# Patient Record
Sex: Female | Born: 1955 | Race: White | Hispanic: No | Marital: Married
Health system: Northeastern US, Academic
[De-identification: ages and names within clinical notes are randomized; demographics above are authoritative.]

---

## 2009-11-20 ENCOUNTER — Ambulatory Visit

## 2015-09-21 ENCOUNTER — Ambulatory Visit

## 2015-10-08 ENCOUNTER — Ambulatory Visit

## 2015-10-08 NOTE — Progress Notes (Signed)
Did you self refer to any specialists?  [facility]   Age: 60 Years Old  Last Secure Message:  02/21/2014 - Secure Messaging: Patient Portal Pin Creator  Last PIN change:  02/21/2014         Labs   No eye exam in record! LDL 113 on 08/16/2016HGBA1C =       Most Recent Visits     (Last rx/phone contact: 09/21/2015 )        10/07/2014  Geraldo Pitter MD (.HPI)    11/11/2013  Geraldo Pitter MD (.HPI)                                      Next Appointment(s)  None     Immunizations  No flu vaccine this season     Childhood DTaP  03/26/2010       No Adult Tetanus/Adacel documented !   Last Flu  01/25/2010    Adult PCV-13  10/07/2014      Mammogram: Benign, no evidence of malignancy, f/u in 12 months  Date of Exam: 09/22/2014   PAP: Negative for intraepithelial lesion/malignancyDate of Exam: 2016 . Levi Aland ) pt has appt in august   Bone Density: Complete Date of Exam:  11/11/2014  Colonoscopy: Large rectal polyp Date of Exam:  09/08/2006.      Protocols Due  HEP C AB, PAP SMEAR, TDAP.           Directives   The above represents the pre-visit preparations conducted for this patient.      History of Present Illness:  Ms. 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 osteopenia with a T score of -2 in the spine and normal bone density in the hips in 2016, tubular adenoma removed in 2008 with a repeat normal colonoscopy in 2013, extensive right foot surgery January 2016 with persistent swelling and pain and a steroid injection couple of months ago but continues to have symptoms, left foot hammertoe surgery in 2017, bilateral breast reduction surgery in November 2016 prior history of lumpectomy presents to the office for her annual physical  exam.    she has ongoing issues with right foot and ankle pain as mentioned above.    History of colonic polyps with tubular adenoma    she reports an increase in frequency and severity of migraine headaches.  She has been on Topamax for over 20 years of prophylaxis of the recent increase in dose to 50 mg twice daily. she is followed by neurology          She had been diagnosed with long-standing history of peripheral neuropathy and mild thought to be secondary to radiation treatment for cervical cancer.  She had history of cervical cancer at the age of 6 treated with cone biopsy and a recurrence that needed chemotherapy and radiation. she is followed by Gyn/Onc annually. She is status post chemotherapy, lumpectomy and radiation for right breast carcinoma in 2001. follows at [facility]    She has had long-standing symptoms of tingling and numbness in both feet,  weakness of the left lower extremity, gait imbalance.  Over the past few years she has developed symptoms in the right lower extremity  as well.  She had an MRI of the lumbosacral spine in June 2012, 2 EMGs in 2005 and a repeat in 2009 that showed significant neuropathy so far thought to be secondary to radiation damage.  At one point she was diagnosed with sciatica and had epidural steroid injections with no benefit.    she had extensive neurological and rheumatological workup was otherwise negative results        Review of Systems   General: Denies fever, chills, sweats, anorexia, fatigue, weakness, malaise, weight loss.   Eyes: Denies visual change or blurring, eye pain.   Ears/Nose/Throat: Denies earache, decreased hearing, difficulty swallowing.   Cardiovascular: Denies chest pain or pressure, palpitations, shortness of breath.   Respiratory: Denies dry cough, productive cough, shortness of breath, wheezing.   Gastrointestinal: Denies acid indigestion, nausea, vomiting, diarrhea, abdominal pain, change in bowel habits, constipation, mucous or blood in  stools.   Musculoskeletal: Denies muscle cramps or aches, muscle weakness, morning stiffness, joint pain, joint swelling.   Skin: Denies dry skin, rash, skin ulcers, suspicious lesions, hx of skin cancer.   Psychiatric: Denies anxiety, depression, insomnia.   all others negative  Vital Signs     Weight: 153 lb. Height: 67  in.    BMI: 24.05  BSA: 1.81    Wt chg: 1  Weight: 152 lbs   BMI: 23.89 on 10/07/2014  Pulse rate: 70    Pulse rhythm: regular  Pulse Ox (SpO2): 99  BP: 114/76 - normal cuff, sitting left arm      Patient is experiencing Pain  Location: legs    Intensity: 4    Type: "nerve pain"   Duration: constant    Comments: Annual  Medications and Allergies Reviewed    Signed: Erica A. Molle,[place]....October 08, 2015 8:34 AM  PHQ 2    Over the last 2 weeks, how often have you been bothered by any of the following problems?  1. Little interest or pleasure in doing things:  0   - Not at all  2. Feeling down, depressed, or hopeless:  0   - Not at all        Physical Exam    General:      well developed, well nourished, in no acute distress.    Head:      normocephalic and atraumatic.    Eyes:      PERRL/EOM intact, conjunctiva and sclera clear with out nystagmus.    Ears:      TM's intact and clear with normal canals with grossly normal hearing.    Nose:      no deformity, discharge, inflammation, or lesions.    Mouth:      no deformity or lesions with good dentition.    Neck:      no masses, thyromegaly, or abnormal cervical nodes.    Lungs:      clear bilaterally to auscultation.    Heart:      non-displaced PMI, chest non-tender; regular rate and rhythm, S1, S2 without murmurs, rubs, or gallops  Abdomen:       normal bowel sounds; no hepatosplenomegaly no ventral,umbilical hernias or masses noted.    Msk:      no deformity or scoliosis noted of thoracic or lumbar spine.    Pulses:      pulses normal in all 4 extremities.    Extremities:      no clubbing, cyanosis, edema, or deformity noted with normal full range of  motion of  all joints.    Skin:      intact without lesions or rashes.    Cervical Nodes:      no significant adenopathy.    Axillary Nodes:      no significant adenopathy.    Inguinal Nodes:      no significant adenopathy.    Psych:      alert and cooperative; normal mood and affect; normal attention span and concentration.           Assessment and Plan:     migraines: not doing well on prophylaxis with Topamax,  waiting for biologic meds that are currently under studies    Chronic bilateral lower extremity neuropathy with gait imbalance thought to be a result of remote radiation therapy for cervical cancer.  Underwent extensive foot surgery in January 2016.  See surgical history for details.  Follow-up with surgeon for persistent pain and swelling of the right ankle and foot. status post steroid injection 2 months ago, status post hammertoe surgery on the left earlier this year, was recommended a fusion surgery on the left ankle as well but currently holding off    Remote history of cervical cancer: Follows with GYN    Remote history of breast cancer: regular surveillance by  mammograms. followed by oncology at [facility]    neuropathy:B12/folic acid over-the-counter supplement    Osteopeinia: continue vitamin D supplements, patient exercises regularly    Colonic polyps with history of tubular adenoma, due for repeat colonoscopy    up-to-date with dental and eye exams,up-to-date with vaccinations, bone density test, mammogram, colonoscopy is due    This document was created using voice recognition software and therefore errors may occur.        Problems Reviewed    Medications Removed Today:   RIZATRIPTAN BENZOATE 10 MG TABS (RIZATRIPTAN BENZOATE) Take one tab orally as needed for headache, may repeat once after two hours if needed. max 2tabs/24hrs    New/Revised  Medications Today:   FROVATRIPTAN SUCCINATE 2.5 MG TABS (FROVATRIPTAN SUCCINATE) Take as needed for migraine  FROVATRIPTAN SUCCINATE 2.5 MG TABS (FROVATRIPTAN  SUCCINATE) as needed for migraines        Prescriptions:  FROVATRIPTAN SUCCINATE 2.5 MG TABS (FROVATRIPTAN SUCCINATE) as needed for migraines  #1 x 0   Entered and Authorized by: Geraldo Pitter MD   Signed by: Geraldo Pitter MD on 10/08/2015   Method used: Electronically to      [id number] (retail)     [address] [place], [place]  [address]     Ph: [phone] or [phone]     Fax: [phone]   RxID: [id number]

## 2015-10-09 ENCOUNTER — Ambulatory Visit

## 2015-10-09 NOTE — Progress Notes (Signed)
Orders Added    Orders:  Added new Test order of COL -Colonoscopy (COLONOSCOPY) - Signed  Problems:  Added new problem of SCREENING FOR MALIGNANT NEOPLASM, COLON (ICD-V76.51) (ICD10-Z12.11)

## 2015-10-26 ENCOUNTER — Ambulatory Visit

## 2015-10-26 NOTE — Telephone Encounter (Signed)
Phone Note -       Initial call taken by: Otilio Jefferson,  October 26, 2015 12:13 PM  Initial Details of Call:  patient requested to have a Lymes , having symptoms. An appointment was suggested please call her  Cell [phone]       Follow-up #1  Details: message left to return my call   Action: Left Message for Patient  By: Georga Hacking. Flynn LPN ~ October 26, 2015 1:12 PM    Details: S/W patient she had a tick bite 1 month ago, this was identified as a deer tick, doxy was offered to her by another physician, but she declined.  Recently she has been experiencing a stiff neck at times and this past weekend she had a severe headache accompanied by N/V, she was sick most of the weekend, She has a hx of migraines, but stated this headache was different.  Patient is asking to have a lyme titer drawn, S/W Dr. Domingo Sep, Lyme titer and cbc/diff will be drawn tomorrow, patient aware.   Action: Phone call completed  By: Georga Hacking. [facility] LPN ~ October 26, 2015 2:55 PM        Problems:  Added new problem of BITTEN OR STUNG BY NONVENOMOUS INSECT AND OTHER NONVENOMOUS ARTHROPODS, INITIAL ENCOUNTER (ICD-E906.4) (DGU44-I34.xxxA)  Orders:  Added new Test order of LYME -Lyme Antibody (LYME) - Signed  Added new Test order of CBCD -CBC with Diff** (CBCD) - Signed

## 2015-10-27 ENCOUNTER — Ambulatory Visit

## 2015-10-27 ENCOUNTER — Ambulatory Visit: Admitting: Internal Medicine

## 2015-10-27 LAB — HX CBC W/DIFFX
HX ABSOLUTE BASOPHILS COUNT: 0.01 10*3/uL (ref 0.0–0.33)
HX ABSOLUTE EOSINOPHIL COUNT: 0.11 10*3/uL (ref 0.0–0.88)
HX ABSOLUTE LYMPHS COUNT: 2.74 10*3/uL (ref 0.99–4.84)
HX ABSOLUTE MONOCYTES COUNT: 0.45 10*3/uL (ref 0.18–1.21)
HX ABSOLUTE NEUTROPHIL CNT: 2.63 10*3/uL (ref 1.8–7.7)
HX BASOS: 0.2 %
HX EOS: 1.8 %
HX HEMATOCRIT: 41.9 % (ref 36.0–46.0)
HX HEMOGLOBIN: 14 g/dL (ref 12.0–16.0)
HX IMMATURE GRANULOCYTES: 0.3 % (ref 0.0–1.0)
HX LYMPHS: 46 %
HX MEAN CORP.HEMO.CONC.: 33.4 g/dL (ref 31.0–37.0)
HX MEAN CORPUSCULAR HEMOGLOBIN: 29.7 pg (ref 26.0–34.0)
HX MEAN CORPUSCULAR VOLUME: 88.8 fL (ref 80.0–100.0)
HX MEAN PLATELET VOLUME: 9.2 fL — ABNORMAL LOW (ref 9.4–12.4)
HX MONOS: 7.6 %
HX PLATELET COUNT: 214 10*3/uL (ref 150.0–400.0)
HX POLYS: 44.1
HX RED BLOOD COUNT: 4.7 M/uL (ref 4.0–5.2)
HX RED CELL DISTRIBUTION WIDTH SD: 42.9 fL (ref 35.0–51.0)
HX WHITE BLOOD COUNT: 6 10*3/uL (ref 4.5–11.0)

## 2015-10-27 LAB — HX LYME DISEASE ANTIBODIESX
HX LYME IGG ABX: NEGATIVE
HX LYME IGM ABX: NEGATIVE

## 2015-10-27 NOTE — Progress Notes (Signed)
Clinical Lists Changes    Orders:  Added new Service order of VENIPUNCTURE ([id number]) - Signed

## 2015-10-28 ENCOUNTER — Ambulatory Visit: Admitting: Internal Medicine

## 2015-10-28 ENCOUNTER — Ambulatory Visit

## 2015-11-27 ENCOUNTER — Ambulatory Visit

## 2015-11-27 NOTE — H&P (Signed)
Moderate Sedation History & PhysicalRoxan Hockey MD      Imported By: Brock Bad 12/03/2015 2:36:02 PM    _____________________________________________________________________    External Attachment:      Type: Image      Comment: External Document

## 2015-12-06 ENCOUNTER — Ambulatory Visit

## 2016-04-02 ENCOUNTER — Ambulatory Visit

## 2016-07-26 ENCOUNTER — Ambulatory Visit

## 2016-09-16 ENCOUNTER — Ambulatory Visit

## 2016-09-16 NOTE — Progress Notes (Signed)
[facility]   [address] [place], [place] [address]  Office: [phone] Fax: [phone]     Rachel Hamilton [phone]-Sep-1957)       Printed: September 16, 2016    Active Medication List and Instructions  September 16, 2016     Rachel Hamilton  08/09/1955    1)  TOPAMAX 25 MG ORAL TABLET (TOPIRAMATE) Take one tablet orally in the morning and two tablets at bedtime  2)  VAGIFEM 10 MCG VAGINAL TABLET (ESTRADIOL) Insert twice weekly  3)  * ASO RIGHT ANKLE BRACE   4)  FROVATRIPTAN SUCCINATE 2.5 MG ORAL TABLET (FROVATRIPTAN SUCCINATE) Take as needed for migraine  5)  FROVATRIPTAN SUCCINATE 2.5 MG ORAL TABLET (FROVATRIPTAN SUCCINATE) as needed for migraines    Allergies:  1)  COMPAZINE (PROCHLORPERAZINE MALEATE) (Critical)    Abbreviations:    bid= twice a day   Q= per  D= day    QD= per day  HS= bed time   QID= 4 times a day  OTC= over the counter  QOD= every other day  PO= by mouth   TID= three times a day  PRN= as needed   TID= 3 times a day

## 2016-10-10 ENCOUNTER — Ambulatory Visit

## 2018-10-03 IMAGING — MG MAMMOGRAPHY SCREENING BILATERAL 3D TOMOSYNTHESIS WITH CAD
8 series · 9 of 24 positions shown · non-contrast
Comparison: Comparison was made to prior exams. 
BREAST DENSITY: (Level B) There are scattered areas of fibroglandular density.

MAMMOGRAPHY SCREENING BILATERAL 3D TOMOSYNTHESIS WITH CAD, 10/03/2018 [DATE]: 
CLINICAL INDICATION: Screening exam.
TECHNIQUE: Digital bilateral mammograms and 3-D Tomosynthesis were obtained. 
These were interpreted both primarily and with the aid of computer-aided 
detection system.

[L MLO]
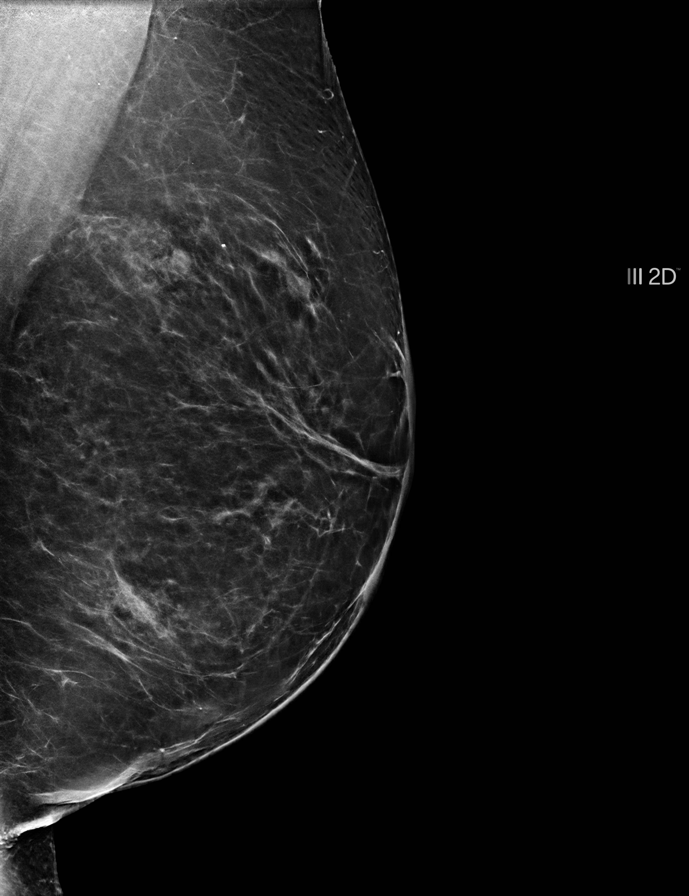

[R CC]
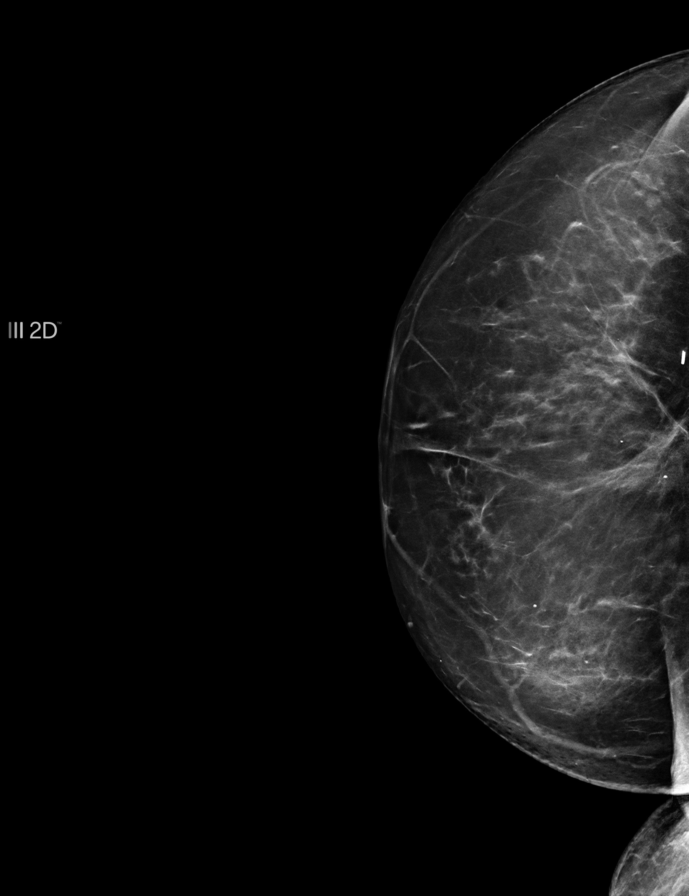

[R MLO]
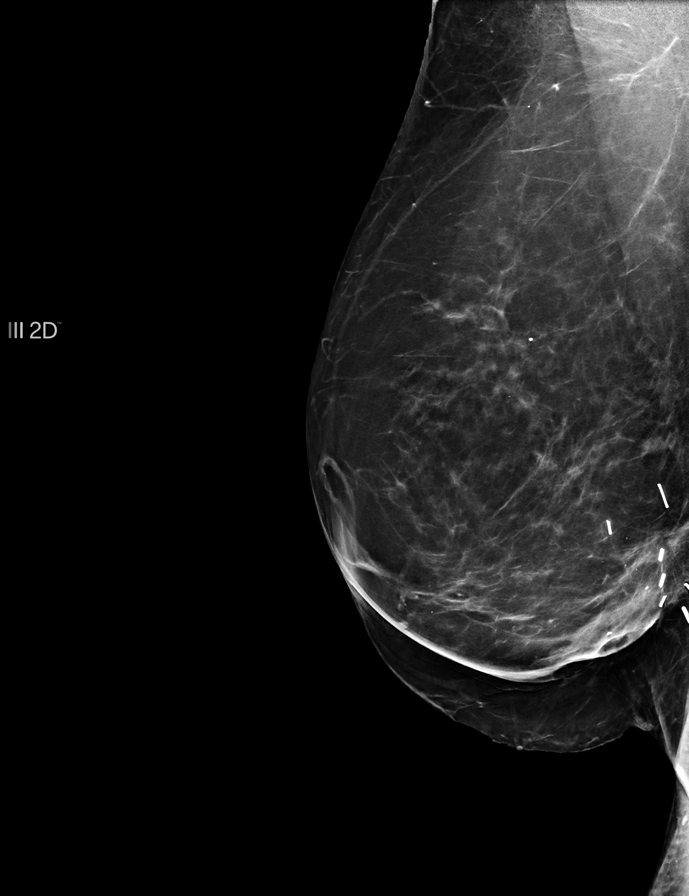

[L CC]
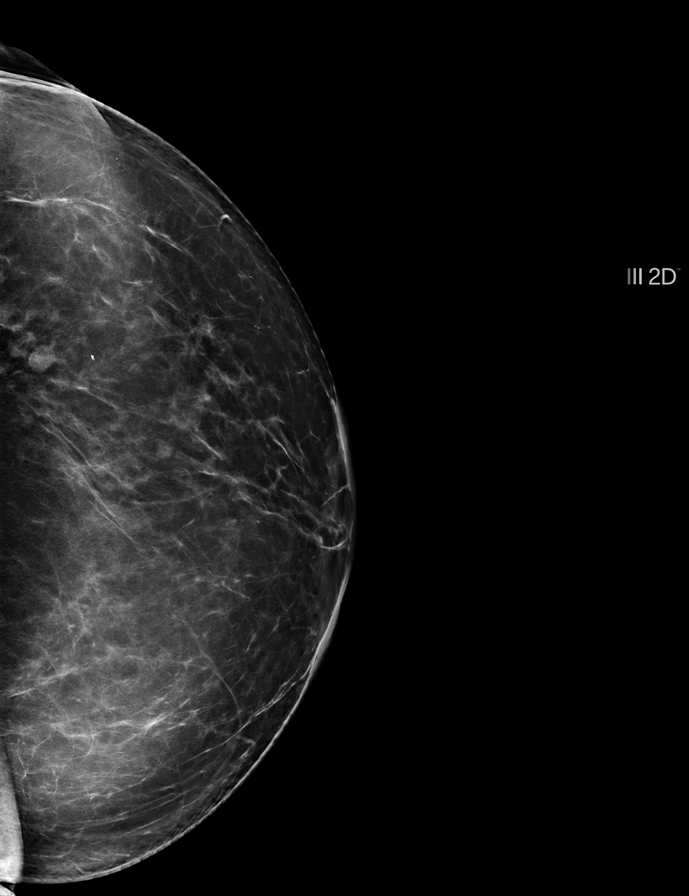

[L CC tomo · 2 of 85 frames shown]
[frame 28/85]
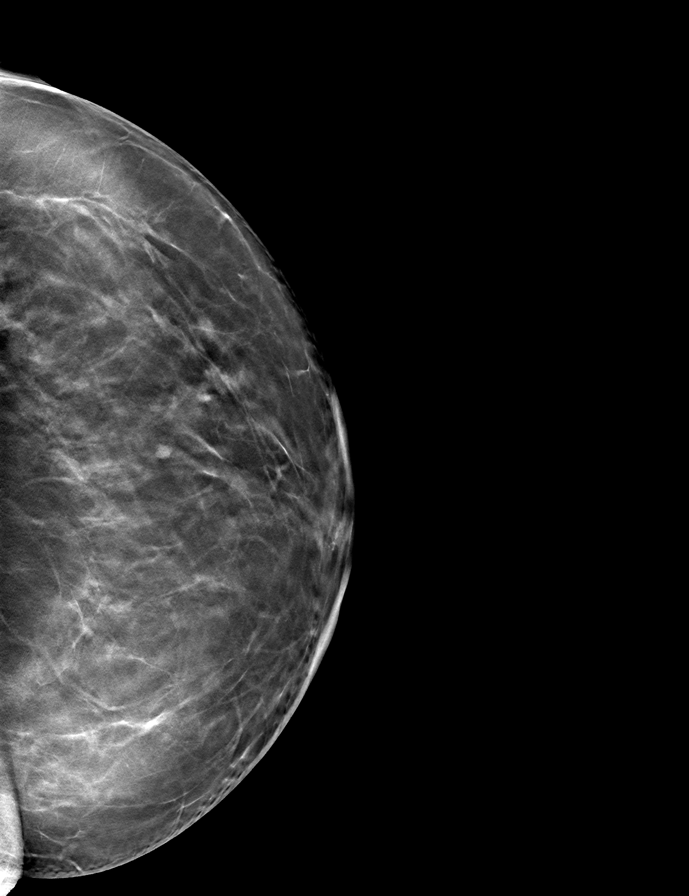
[frame 43/85]
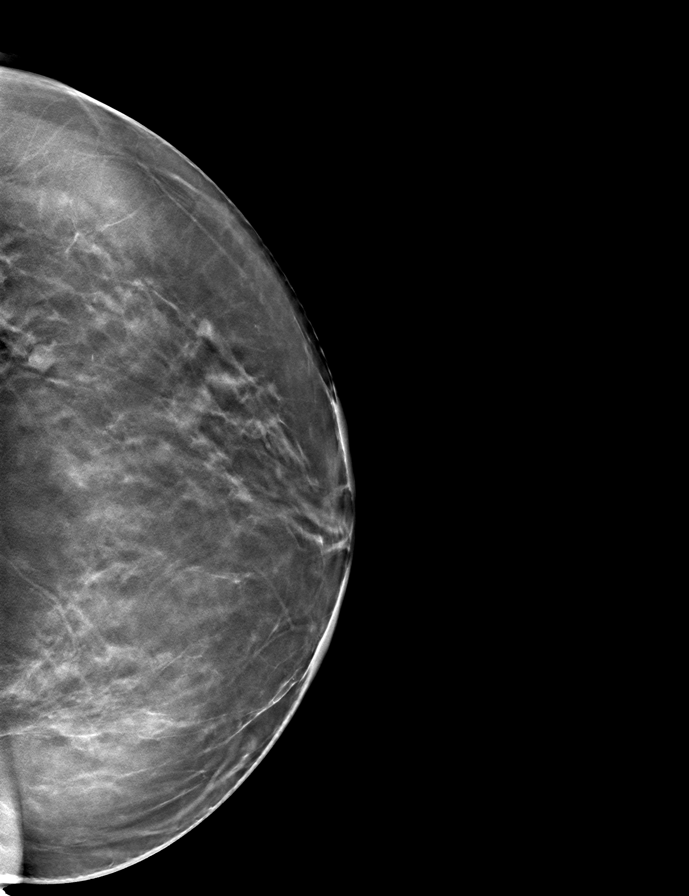

[R MLO tomo · tomo slice 41/80.0]
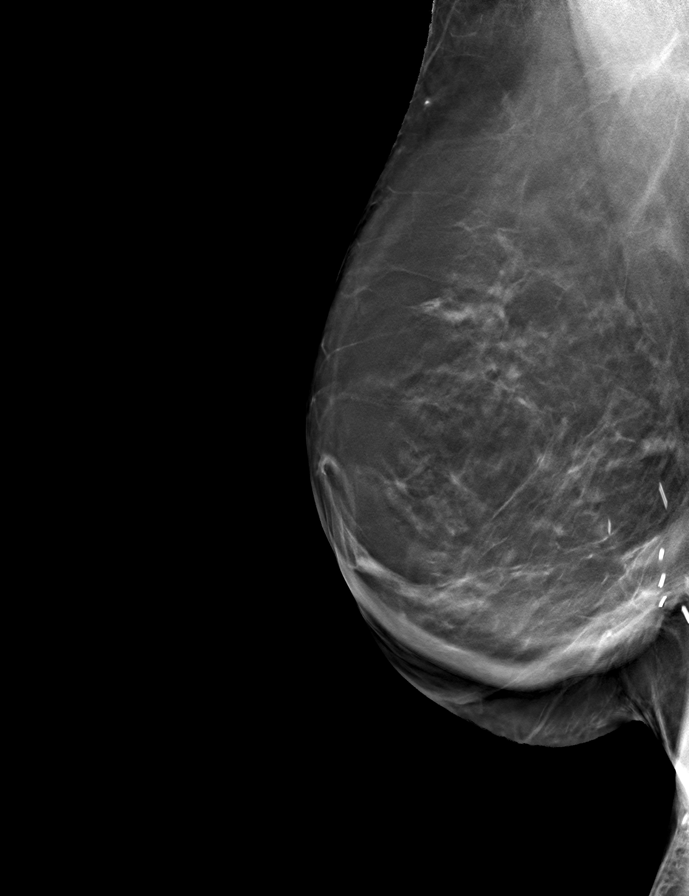

[R CC tomo · tomo slice 37/73.0]
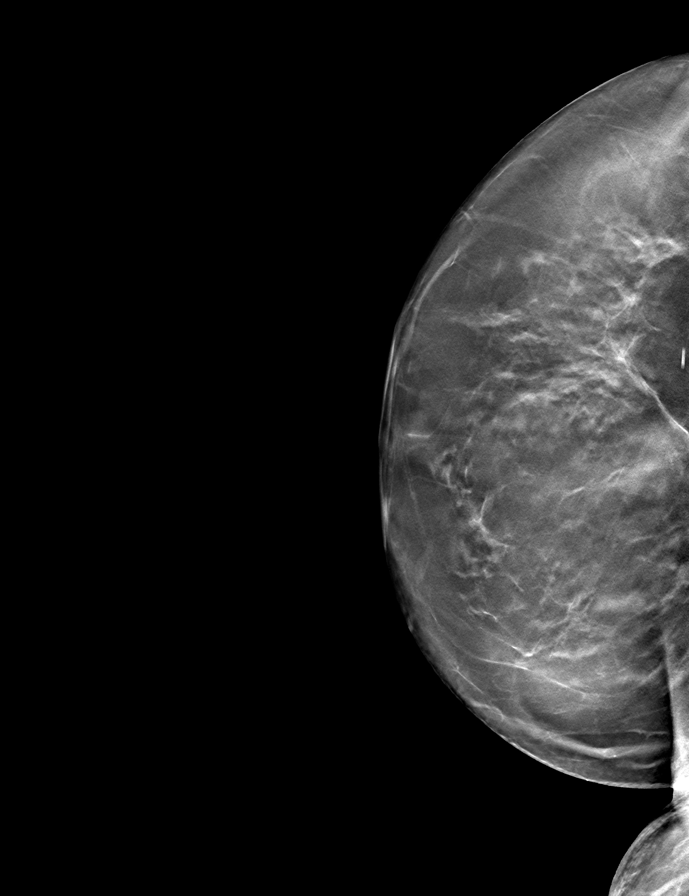

[L MLO tomo · tomo slice 41/81.0]
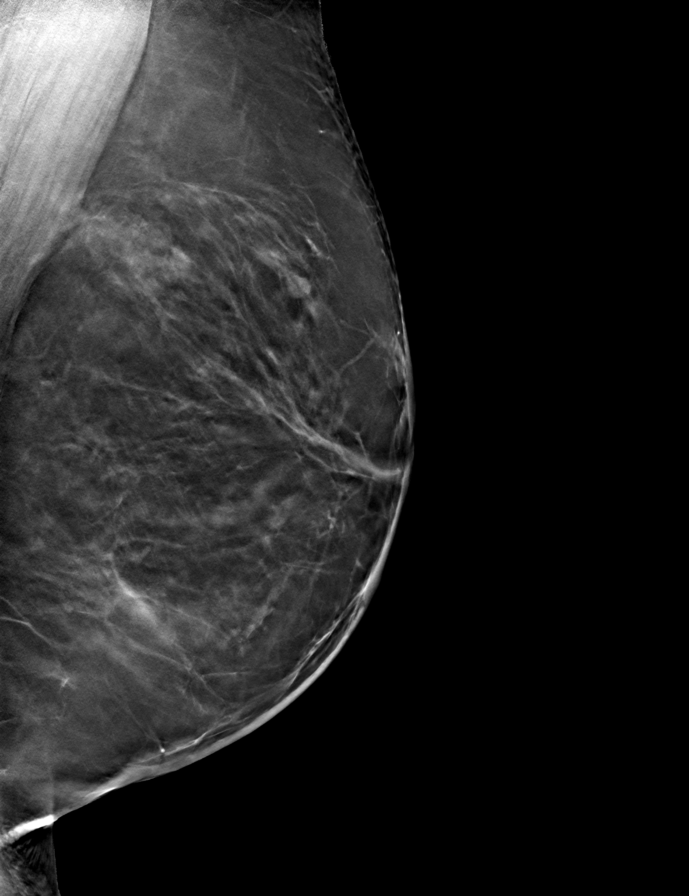

[9 of 24 positions shown; findings below may reference images not displayed]

FINDINGS: Postlumpectomy changes involving the right breast are unchanged 
compared to prior examination. Postsurgical changes of breast reduction are 
present bilaterally.No suspicious mass, calcifications, or area of architectural 
distortion in either breast.
IMPRESSION: No mammographic evidence of malignancy in either breast. 
( BI-RADS 2) Benign findings. Routine mammographic follow-up is recommended.

## 2019-10-07 IMAGING — MG MAMMOGRAPHY SCREENING BILATERAL 3D TOMOSYNTHESIS WITH CAD
6 of 12 series · 6 of 36 positions shown · non-contrast
Comparison: Comparison was made to prior exams. 
BREAST DENSITY: (Level B) There are scattered areas of fibroglandular density.

MAMMOGRAPHY SCREENING BILATERAL 3D TOMOSYNTHESIS WITH CAD, 10/07/2019 [DATE]: 
CLINICAL INDICATION: Screening exam.
TECHNIQUE: Digital bilateral mammograms and 3-D Tomosynthesis were obtained. 
These were interpreted both primarily and with the aid of computer-aided 
detection system.

[L CC]
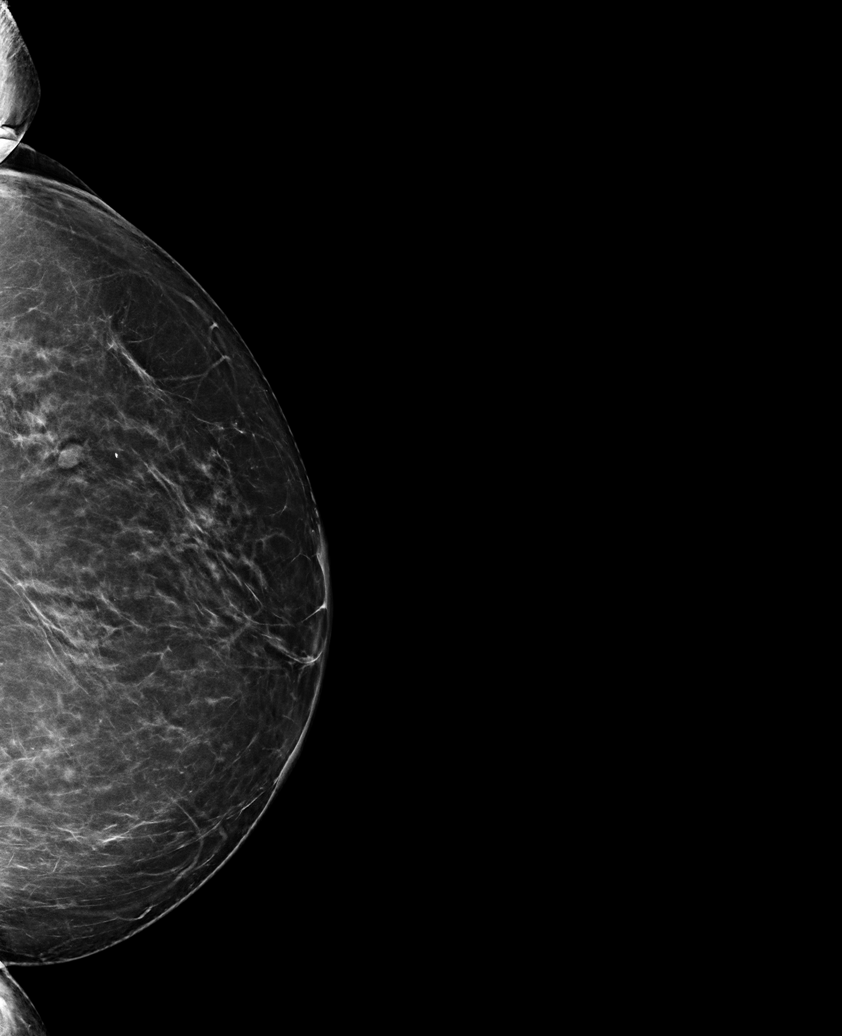

[R MLO (1 of 2)]
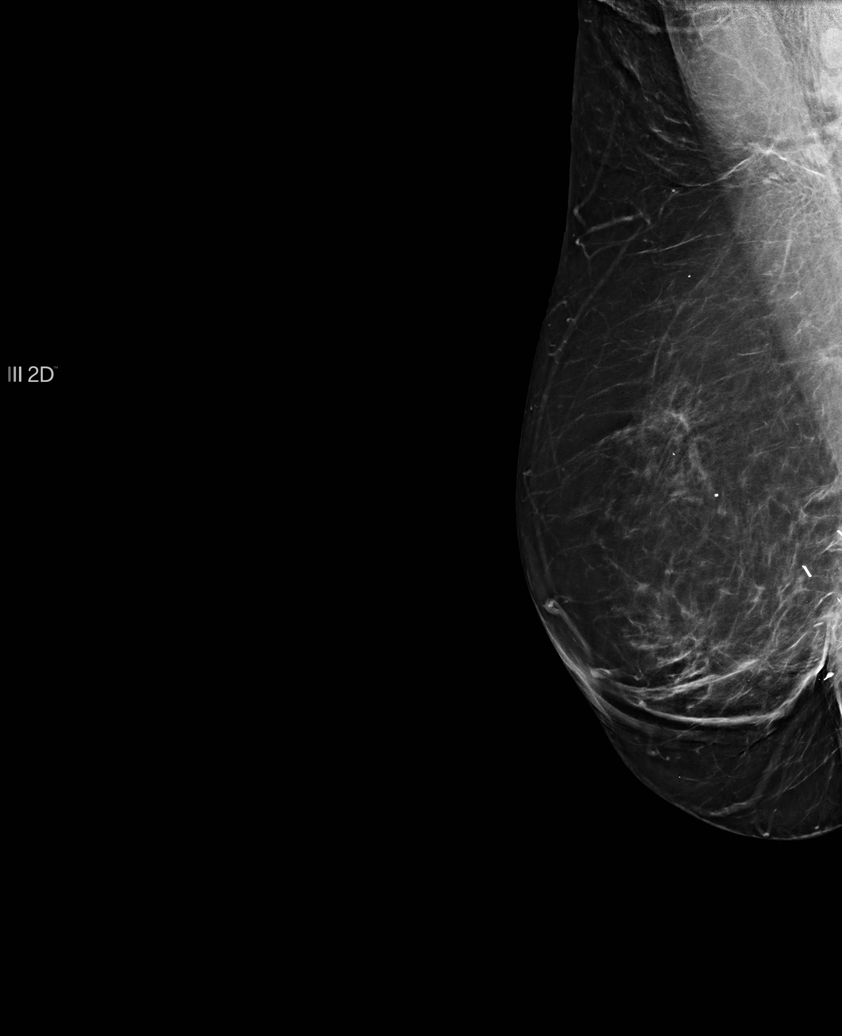

[R MLO (2 of 2)]
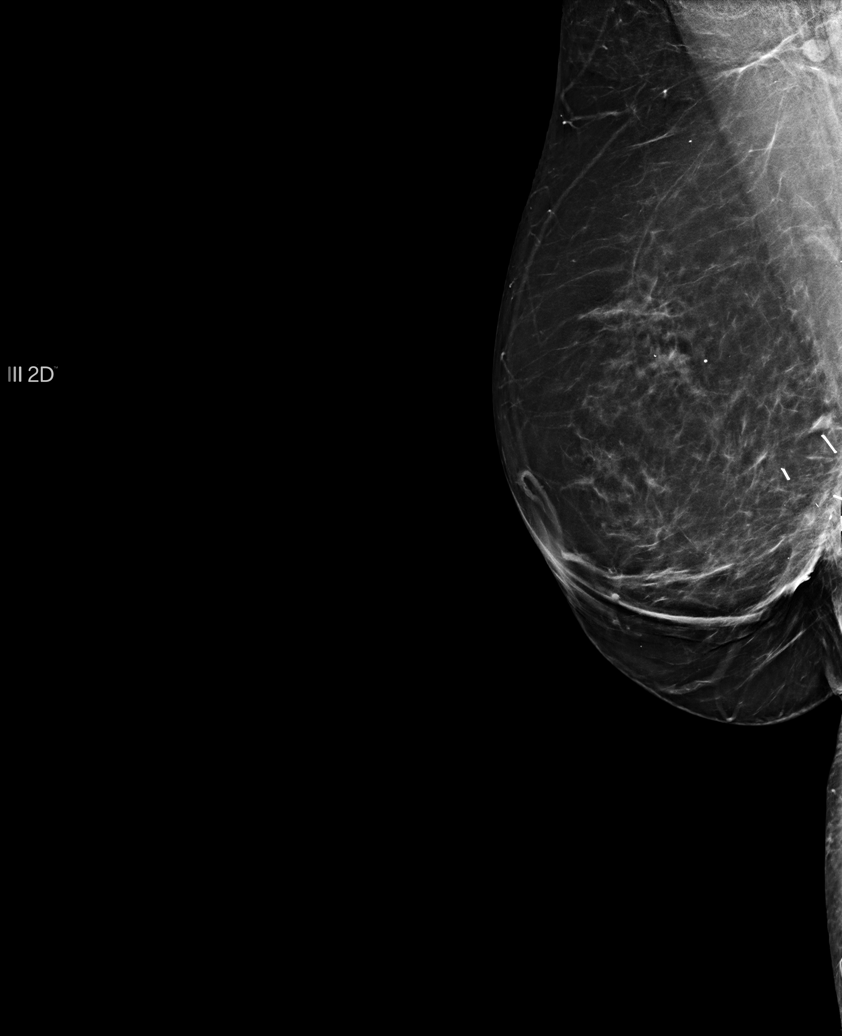

[R CC (1 of 2)]
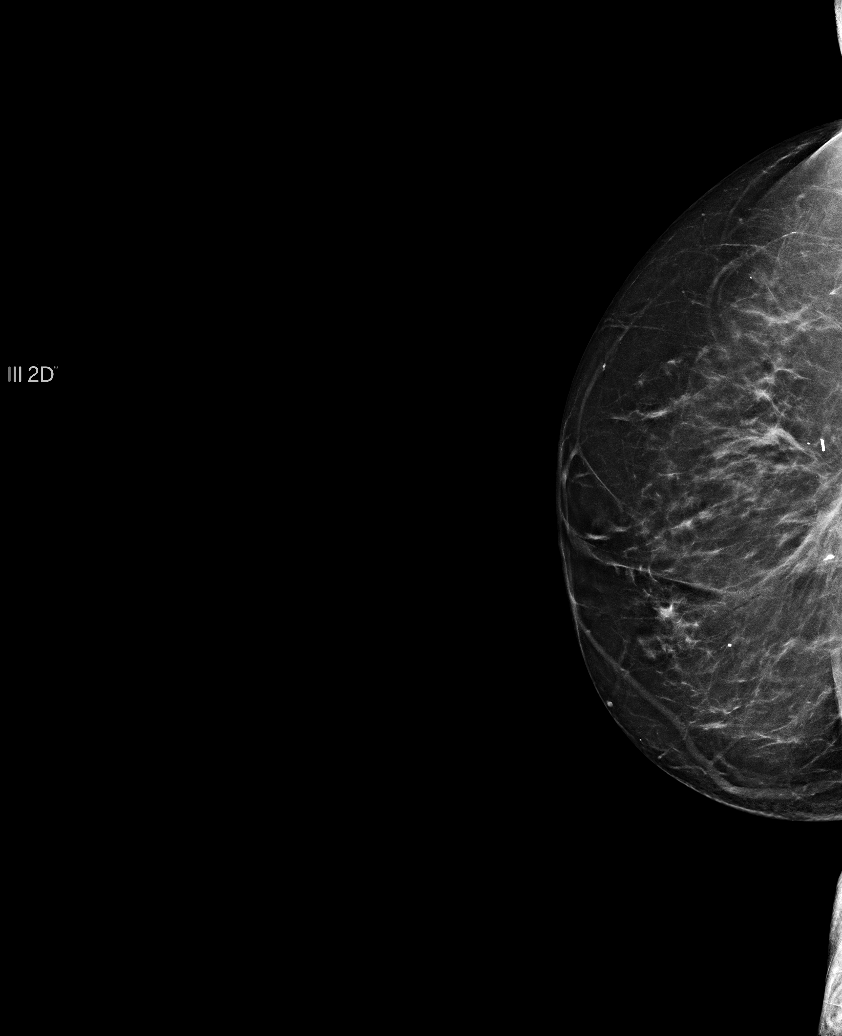

[L MLO]
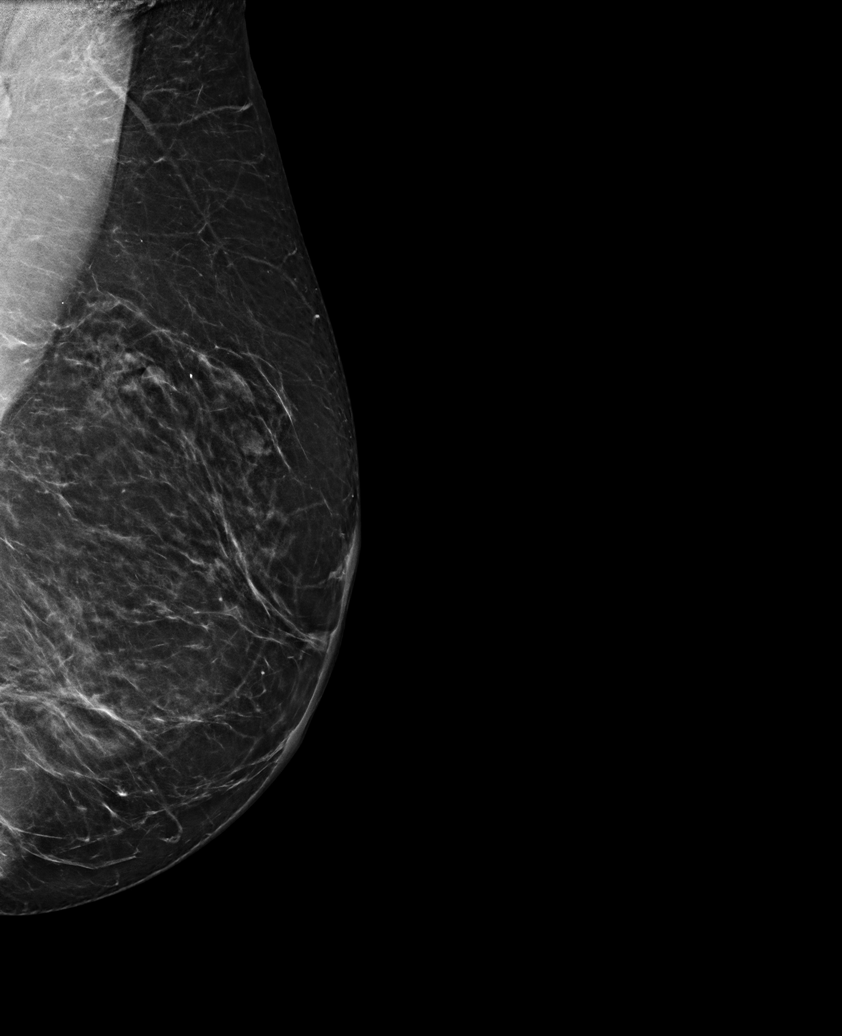

[R CC (2 of 2)]
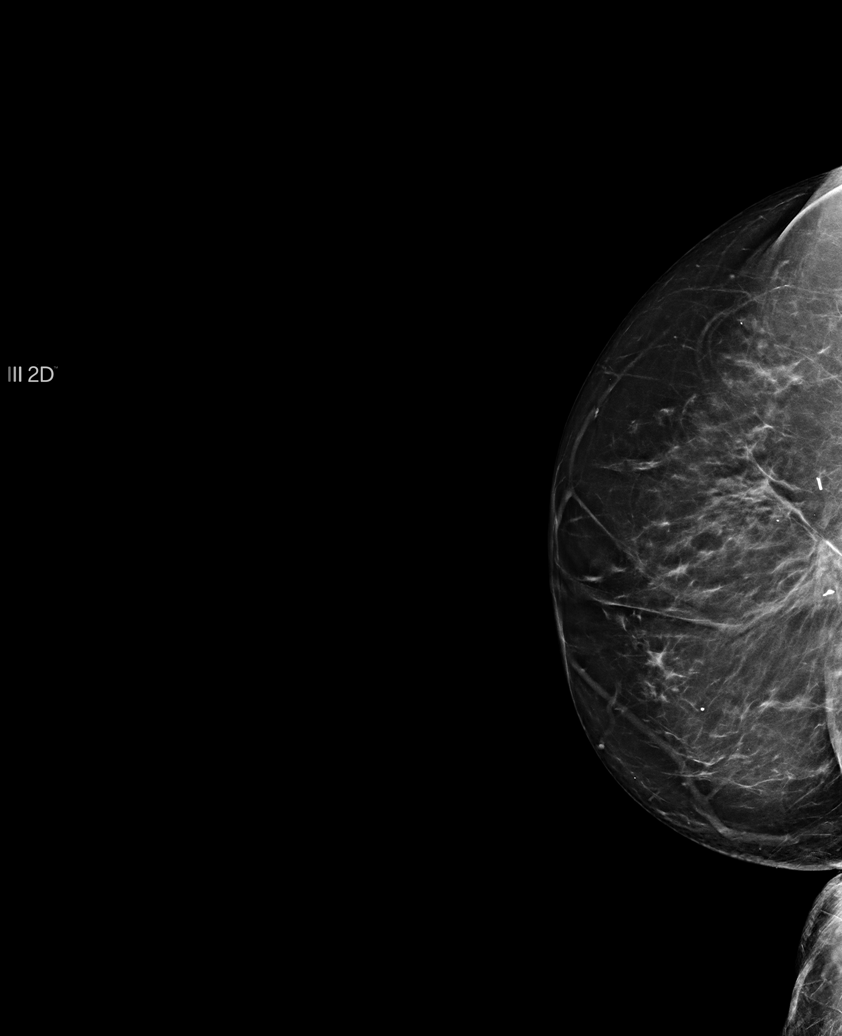

[6 of 36 positions shown; findings below may reference images not displayed]

FINDINGS: Stable appearing lumpectomy changes involving the right breast. There 
are reduction changes involving the breasts bilaterally.No suspicious mass, 
calcifications, or area of architectural distortion in either breast.
IMPRESSION: No mammographic evidence of malignancy in either breast. 
( BI-RADS 2) Benign findings. Routine mammographic follow-up is recommended.

## 2020-07-08 NOTE — H&P (Signed)
Moderate Sedation History & PhysicalRoxan Hockey MD      Imported By: Brock Bad 12/03/2015 2:36:02 PM    _____________________________________________________________________    External Attachment:      Type: Image      Comment: External Document

## 2020-07-08 NOTE — Teleconsult (Signed)
Phone Note -       Initial call taken by: Otilio Jefferson,  October 26, 2015 12:13 PM  Initial Details of Call:  patient requested to have a Lymes , having symptoms. An appointment was suggested please call her  Cell [phone]       Follow-up #1  Details: message left to return my call   Action: Left Message for Patient  By: Georga Hacking. Flynn LPN ~ October 26, 2015 1:12 PM    Details: S/W patient she had a tick bite 1 month ago, this was identified as a deer tick, doxy was offered to her by another physician, but she declined.  Recently she has been experiencing a stiff neck at times and this past weekend she had a severe headache accompanied by N/V, she was sick most of the weekend, She has a hx of migraines, but stated this headache was different.  Patient is asking to have a lyme titer drawn, S/W Dr. Domingo Sep, Lyme titer and cbc/diff will be drawn tomorrow, patient aware.   Action: Phone call completed  By: Georga Hacking. [facility] LPN ~ October 26, 2015 2:55 PM        Problems:  Added new problem of BITTEN OR STUNG BY NONVENOMOUS INSECT AND OTHER NONVENOMOUS ARTHROPODS, INITIAL ENCOUNTER (ICD-E906.4) (DGU44-I34.xxxA)  Orders:  Added new Test order of LYME -Lyme Antibody (LYME) - Signed  Added new Test order of CBCD -CBC with Diff** (CBCD) - Signed

## 2020-07-14 NOTE — Progress Notes (Signed)
Did you self refer to any specialists?  [facility]   Age: 65 Years Old  Last Secure Message:  02/21/2014 - Secure Messaging: Patient Portal Pin Creator  Last PIN change:  02/21/2014         Labs   No eye exam in record! LDL 113 on 08/16/2016HGBA1C =       Most Recent Visits     (Last rx/phone contact: 09/21/2015 )        10/07/2014  Geraldo Pitter MD (.HPI)    11/11/2013  Geraldo Pitter MD (.HPI)                                      Next Appointment(s)  None     Immunizations  No flu vaccine this season     Childhood DTaP  03/26/2010       No Adult Tetanus/Adacel documented !   Last Flu  01/25/2010    Adult PCV-13  10/07/2014      Mammogram: Benign, no evidence of malignancy, f/u in 12 months  Date of Exam: 09/22/2014   PAP: Negative for intraepithelial lesion/malignancyDate of Exam: 2016 . Levi Aland ) pt has appt in august   Bone Density: Complete Date of Exam:  11/11/2014  Colonoscopy: Large rectal polyp Date of Exam:  09/08/2006.      Protocols Due  HEP C AB, PAP SMEAR, TDAP.           Directives   The above represents the pre-visit preparations conducted for this patient.      History of Present Illness:  Ms. 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 osteopenia with a T score of -2 in the spine and normal bone density in the hips in 2016, tubular adenoma removed in 2008 with a repeat normal colonoscopy in 2013, extensive right foot surgery January 2016 with persistent swelling and pain and a steroid injection couple of months ago but continues to have symptoms, left foot hammertoe surgery in 2017, bilateral breast reduction surgery in November 2016 prior history of lumpectomy presents to the office for her annual physical  exam.    she has ongoing issues with right foot and ankle pain as mentioned above.    History of colonic polyps with tubular adenoma    she reports an increase in frequency and severity of migraine headaches.  She has been on Topamax for over 20 years of prophylaxis of the recent increase in dose to 50 mg twice daily. she is followed by neurology          She had been diagnosed with long-standing history of peripheral neuropathy and mild thought to be secondary to radiation treatment for cervical cancer.  She had history of cervical cancer at the age of 6 treated with cone biopsy and a recurrence that needed chemotherapy and radiation. she is followed by Gyn/Onc annually. She is status post chemotherapy, lumpectomy and radiation for right breast carcinoma in 2001. follows at [facility]    She has had long-standing symptoms of tingling and numbness in both feet,  weakness of the left lower extremity, gait imbalance.  Over the past few years she has developed symptoms in the right lower extremity  as well.  She had an MRI of the lumbosacral spine in June 2012, 2 EMGs in 2005 and a repeat in 2009 that showed significant neuropathy so far thought to be secondary to radiation damage.  At one point she was diagnosed with sciatica and had epidural steroid injections with no benefit.    she had extensive neurological and rheumatological workup was otherwise negative results        Review of Systems   General: Denies fever, chills, sweats, anorexia, fatigue, weakness, malaise, weight loss.   Eyes: Denies visual change or blurring, eye pain.   Ears/Nose/Throat: Denies earache, decreased hearing, difficulty swallowing.   Cardiovascular: Denies chest pain or pressure, palpitations, shortness of breath.   Respiratory: Denies dry cough, productive cough, shortness of breath, wheezing.   Gastrointestinal: Denies acid indigestion, nausea, vomiting, diarrhea, abdominal pain, change in bowel habits, constipation, mucous or blood in  stools.   Musculoskeletal: Denies muscle cramps or aches, muscle weakness, morning stiffness, joint pain, joint swelling.   Skin: Denies dry skin, rash, skin ulcers, suspicious lesions, hx of skin cancer.   Psychiatric: Denies anxiety, depression, insomnia.   all others negative  Vital Signs     Weight: 153 lb. Height: 67  in.    BMI: 24.05  BSA: 1.81    Wt chg: 1  Weight: 152 lbs   BMI: 23.89 on 10/07/2014  Pulse rate: 70    Pulse rhythm: regular  Pulse Ox (SpO2): 99  BP: 114/76 - normal cuff, sitting left arm      Patient is experiencing Pain  Location: legs    Intensity: 4    Type: "nerve pain"   Duration: constant    Comments: Annual  Medications and Allergies Reviewed    Signed: Erica A. Molle,[place]....October 08, 2015 8:34 AM  PHQ 2    Over the last 2 weeks, how often have you been bothered by any of the following problems?  1. Little interest or pleasure in doing things:  0   - Not at all  2. Feeling down, depressed, or hopeless:  0   - Not at all        Physical Exam    General:      well developed, well nourished, in no acute distress.    Head:      normocephalic and atraumatic.    Eyes:      PERRL/EOM intact, conjunctiva and sclera clear with out nystagmus.    Ears:      TM's intact and clear with normal canals with grossly normal hearing.    Nose:      no deformity, discharge, inflammation, or lesions.    Mouth:      no deformity or lesions with good dentition.    Neck:      no masses, thyromegaly, or abnormal cervical nodes.    Lungs:      clear bilaterally to auscultation.    Heart:      non-displaced PMI, chest non-tender; regular rate and rhythm, S1, S2 without murmurs, rubs, or gallops  Abdomen:       normal bowel sounds; no hepatosplenomegaly no ventral,umbilical hernias or masses noted.    Msk:      no deformity or scoliosis noted of thoracic or lumbar spine.    Pulses:      pulses normal in all 4 extremities.    Extremities:      no clubbing, cyanosis, edema, or deformity noted with normal full range of  motion of  all joints.    Skin:      intact without lesions or rashes.    Cervical Nodes:      no significant adenopathy.    Axillary Nodes:      no significant adenopathy.    Inguinal Nodes:      no significant adenopathy.    Psych:      alert and cooperative; normal mood and affect; normal attention span and concentration.           Assessment and Plan:     migraines: not doing well on prophylaxis with Topamax,  waiting for biologic meds that are currently under studies    Chronic bilateral lower extremity neuropathy with gait imbalance thought to be a result of remote radiation therapy for cervical cancer.  Underwent extensive foot surgery in January 2016.  See surgical history for details.  Follow-up with surgeon for persistent pain and swelling of the right ankle and foot. status post steroid injection 2 months ago, status post hammertoe surgery on the left earlier this year, was recommended a fusion surgery on the left ankle as well but currently holding off    Remote history of cervical cancer: Follows with GYN    Remote history of breast cancer: regular surveillance by  mammograms. followed by oncology at [facility]    neuropathy:B12/folic acid over-the-counter supplement    Osteopeinia: continue vitamin D supplements, patient exercises regularly    Colonic polyps with history of tubular adenoma, due for repeat colonoscopy    up-to-date with dental and eye exams,up-to-date with vaccinations, bone density test, mammogram, colonoscopy is due    This document was created using voice recognition software and therefore errors may occur.        Problems Reviewed    Medications Removed Today:   RIZATRIPTAN BENZOATE 10 MG TABS (RIZATRIPTAN BENZOATE) Take one tab orally as needed for headache, may repeat once after two hours if needed. max 2tabs/24hrs    New/Revised  Medications Today:   FROVATRIPTAN SUCCINATE 2.5 MG TABS (FROVATRIPTAN SUCCINATE) Take as needed for migraine  FROVATRIPTAN SUCCINATE 2.5 MG TABS (FROVATRIPTAN  SUCCINATE) as needed for migraines        Prescriptions:  FROVATRIPTAN SUCCINATE 2.5 MG TABS (FROVATRIPTAN SUCCINATE) as needed for migraines  #1 x 0   Entered and Authorized by: Geraldo Pitter MD   Signed by: Geraldo Pitter MD on 10/08/2015   Method used: Electronically to      [id number] (retail[facility]     [address] [place], [place]  [address]     Ph: [phone] or [phone]     Fax: [phone]   RxID: [id number]

## 2020-10-22 IMAGING — MG MAMMOGRAPHY SCREENING BILATERAL 3[PERSON_NAME]
8 series · 9 of 24 positions shown · non-contrast
Comparison: 09/29/2019 and dating back to 09/18/2013.

FINAL Diagnostic Imaging Report 
________________________________________________________________________________________________ 
MAMMOGRAPHY SCREENING BILATERAL 3PERC MAHABALI, 10/22/2020 [DATE]: 
CLINICAL INDICATION: History of right breast cancer treated with lumpectomy and 
radiation therapy in 6771. History of bilateral breast reduction 7479.
TECHNIQUE: Digital bilateral mammograms and 3-D Tomosynthesis were obtained. 
These were interpreted both primarily and with the aid of computer-aided 
detection system.  
DENSITY: (Level B) There are scattered areas of fibroglandular density.

[R CC]
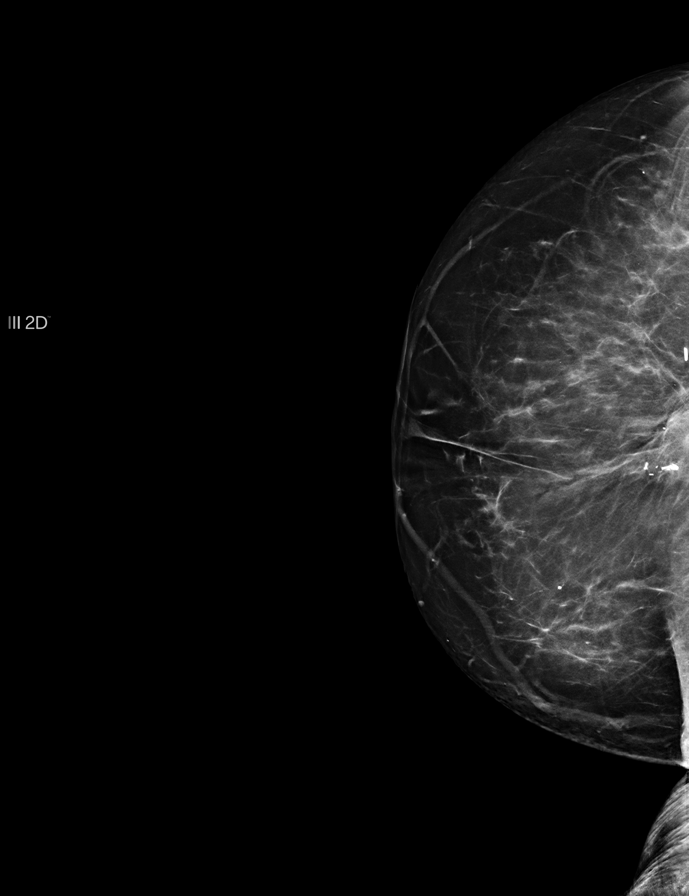

[L MLO]
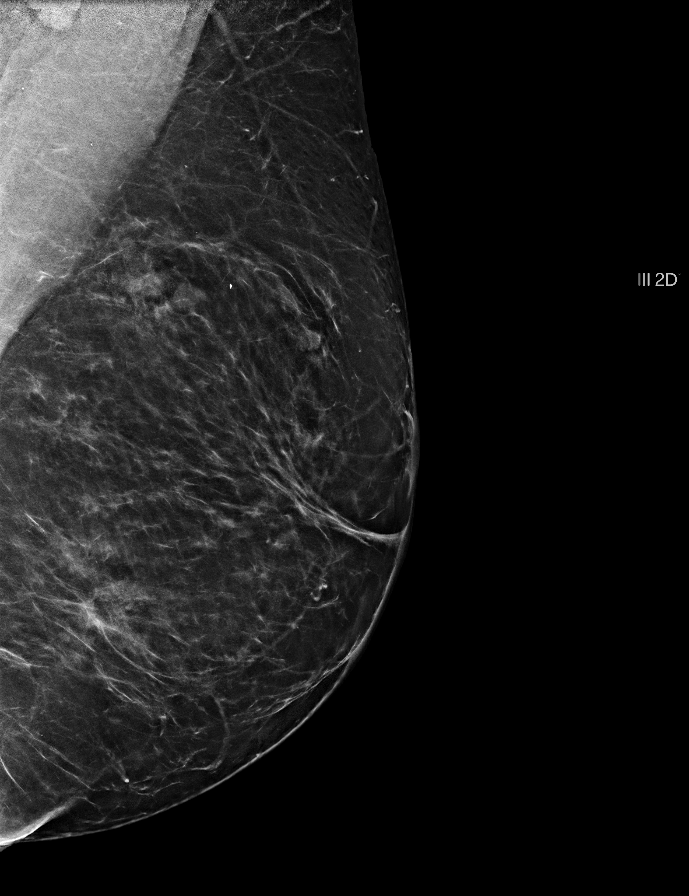

[L CC]
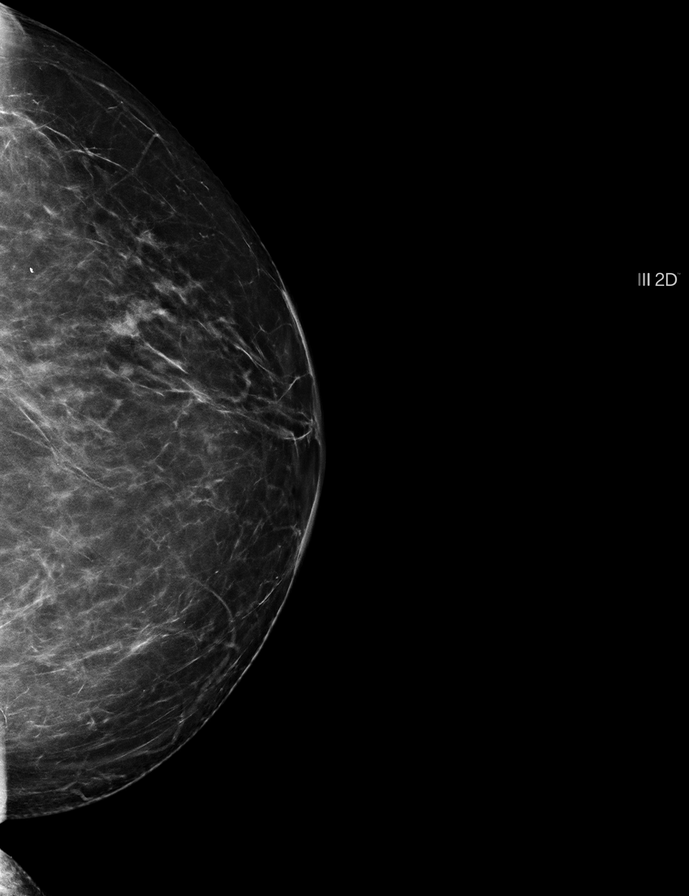

[R MLO]
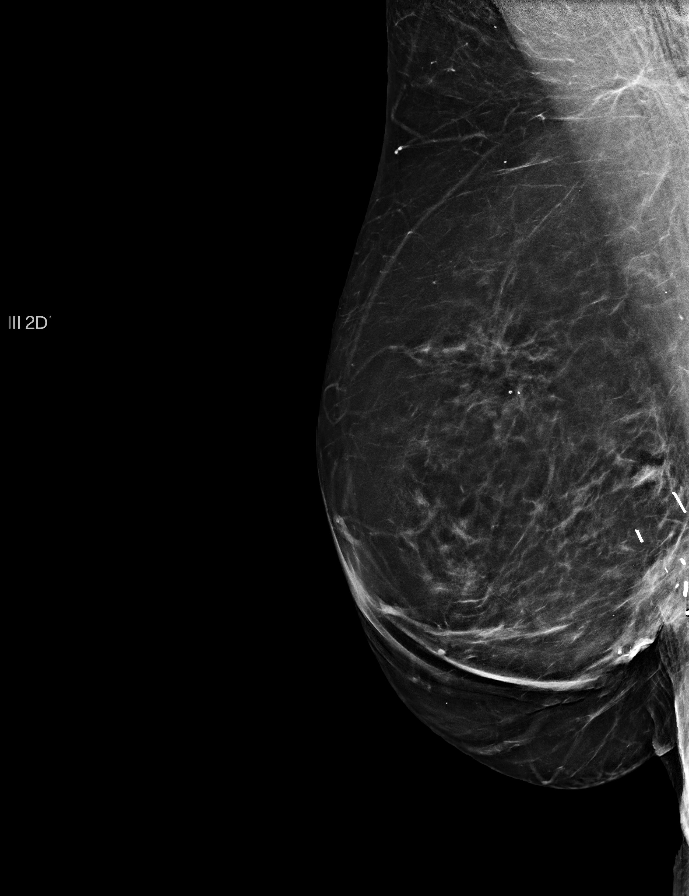

[R CC tomo · 2 of 82 frames shown]
[frame 27/82]
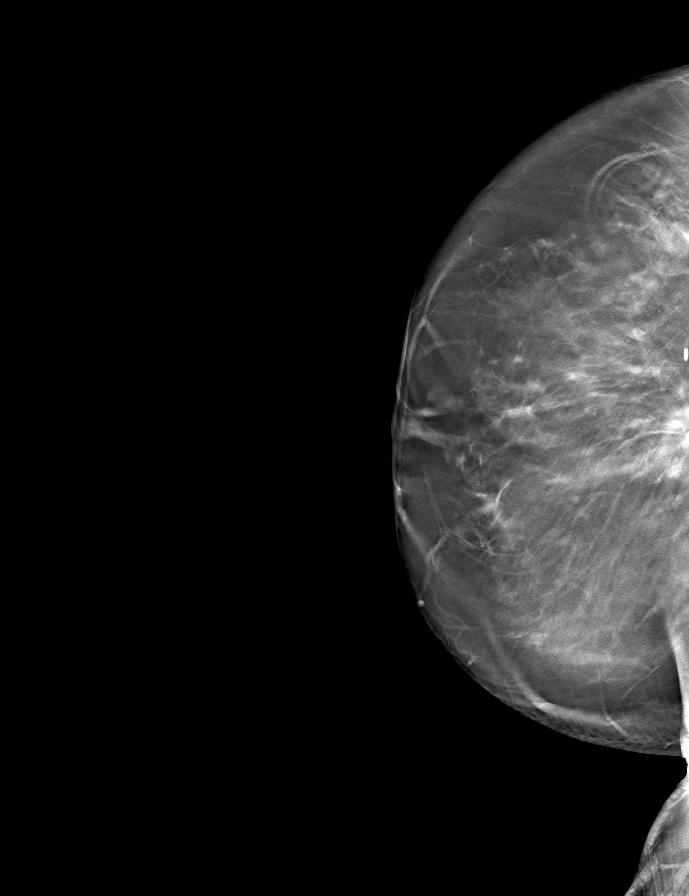
[frame 41/82]
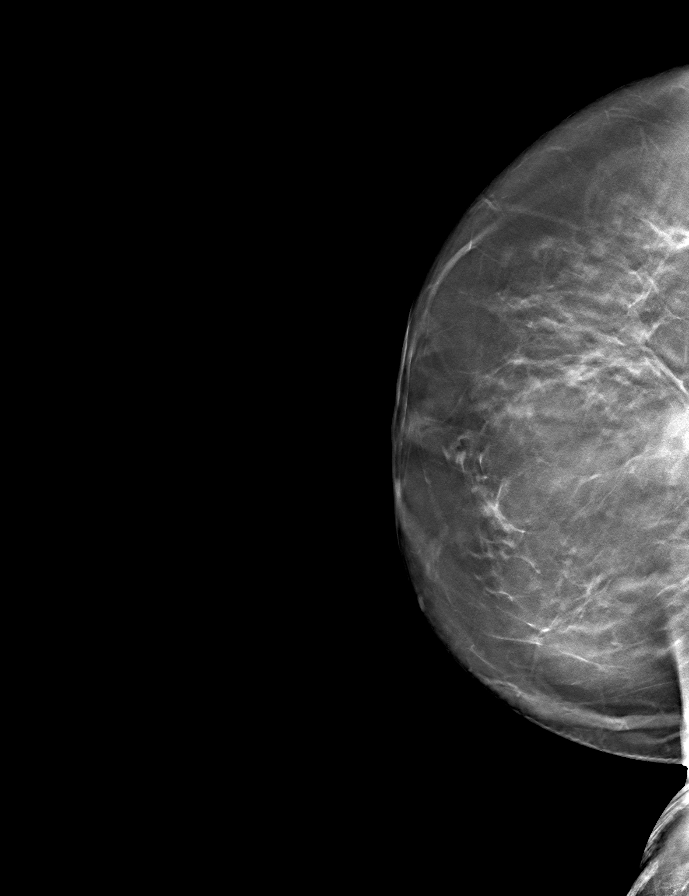

[L MLO tomo · tomo slice 43/84.0]
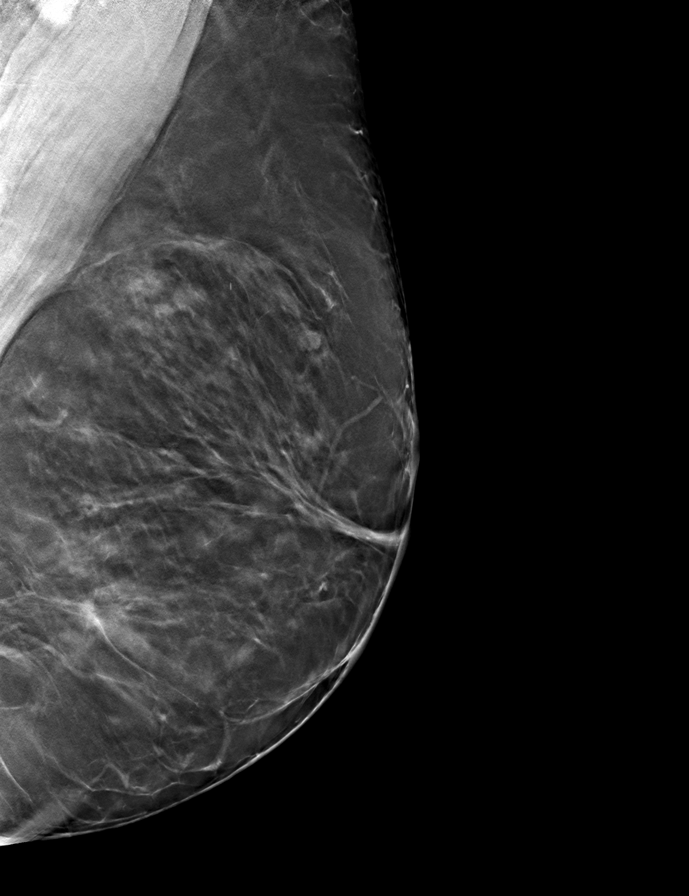

[L CC tomo · tomo slice 42/83.0]
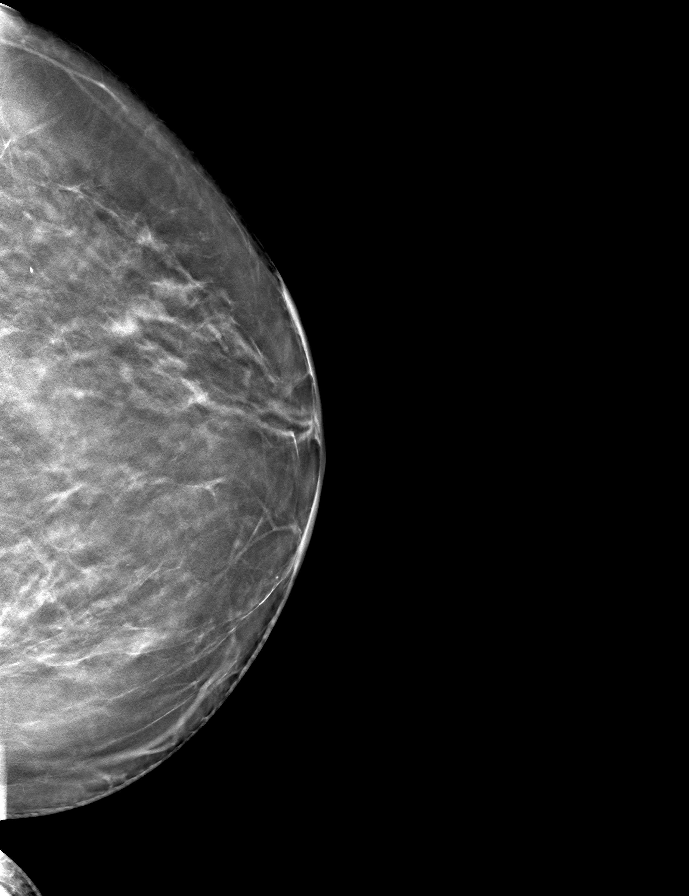

[R MLO tomo · tomo slice 43/86.0]
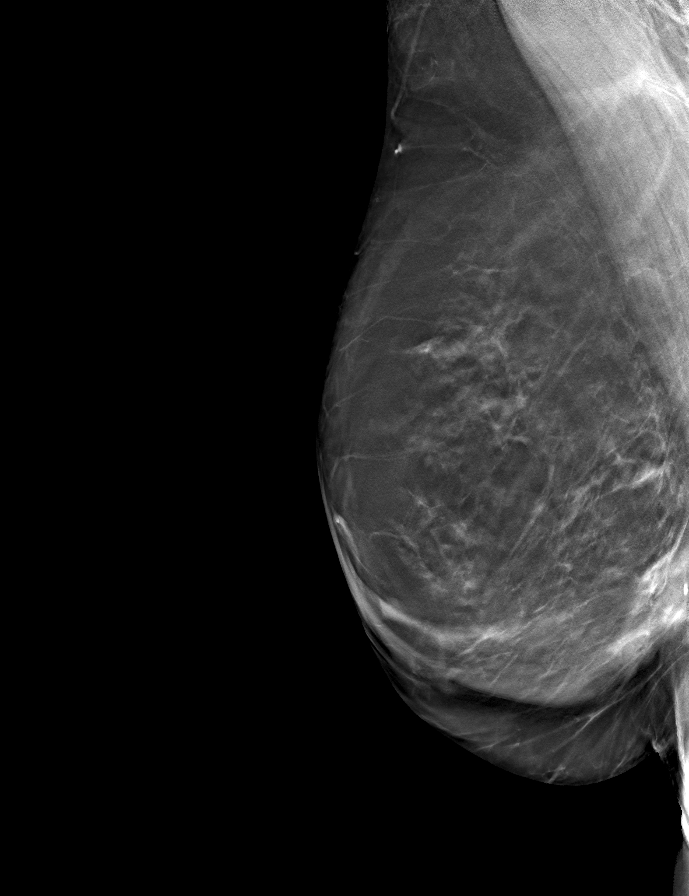

[9 of 24 positions shown; findings below may reference images not displayed]

FINDINGS: Posttreatment changes right breast. No suspicious mass, calcifications, 
or area of architectural distortion in either breast. Overall stable 
mammographic appearance.
IMPRESSION: No mammographic evidence of malignancy in either breast. 
( BI-RADS 2) Benign findings. Routine mammographic follow-up is recommended.

## 2020-11-25 IMAGING — CT CT VIRTUAL COLONOSCOPY SCREENING
2 of 6 series · 15 of 46 positions shown, 17 images · non-contrast
Comparison: None

________________________________________________________________________________________________ 
CT VIRTUAL COLONOSCOPY SCREENING, 11/25/2020 [DATE]: 
CLINICAL INDICATION: History of colon polyps. Patient for screening for 
colonoscopy 
A search for DICOM formatted images was conducted for prior CT imaging studies 
completed at a non-affiliated media free facility.
TECHNIQUE: The abdomen and pelvis were scanned without contrast on a high 
resolution CT scanner using dose reduction techniques. The colon was insufflated 
with a pressure of 25 mmHg of carbon dioxide and supine and prone CT images were 
obtained. The patient had been prepped with a stool tagging agent.

[Series 2: colo_supine 3.0 b31s · axial · 0.78mm/px · z∈[-463,-46]mm · 12 of 161 slices shown, 14 images]
[im 11/161  soft-tissue]
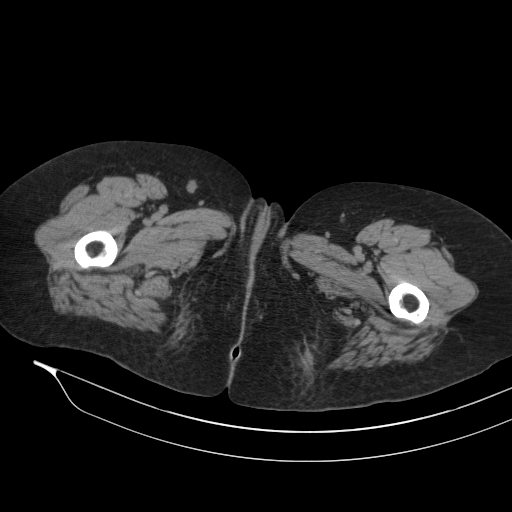
[im 11/161  bone]
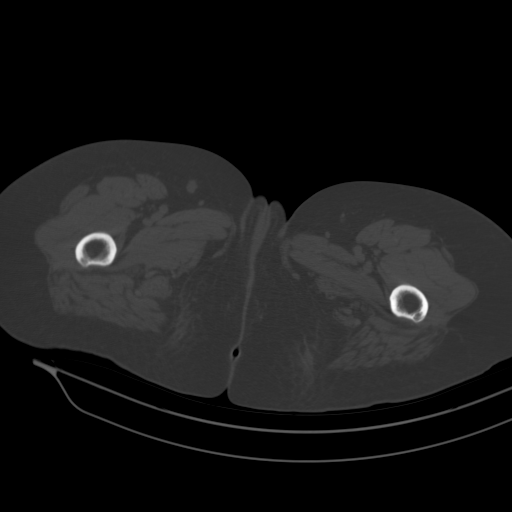
[im 22/161  soft-tissue]
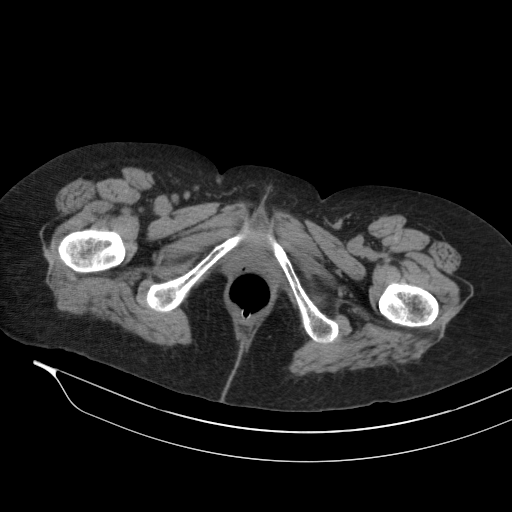
[im 33/161  soft-tissue]
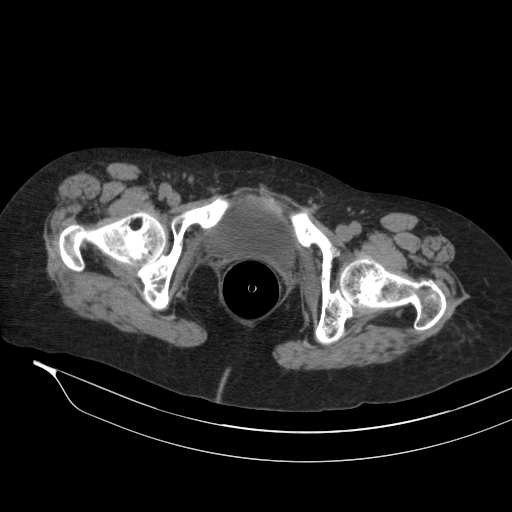
[im 54/161  soft-tissue]
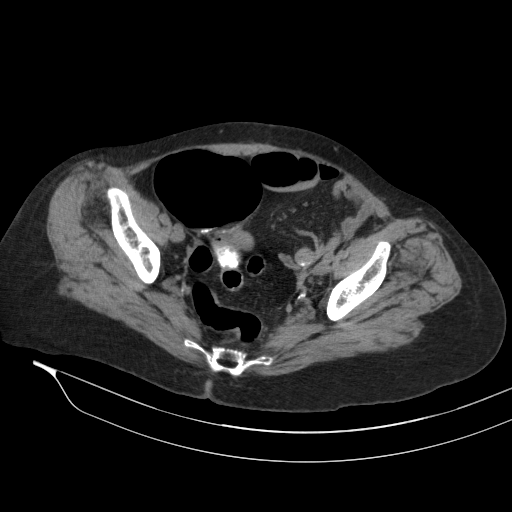
[im 65/161  soft-tissue]
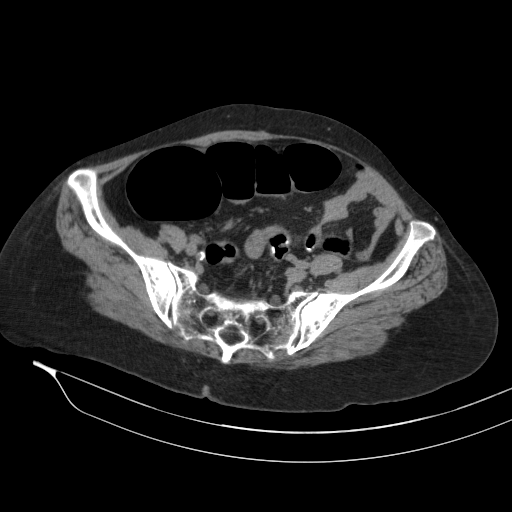
[im 75/161  soft-tissue]
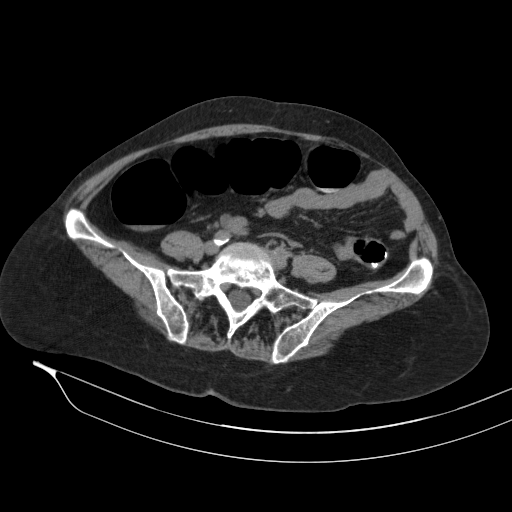
[im 86/161  soft-tissue]
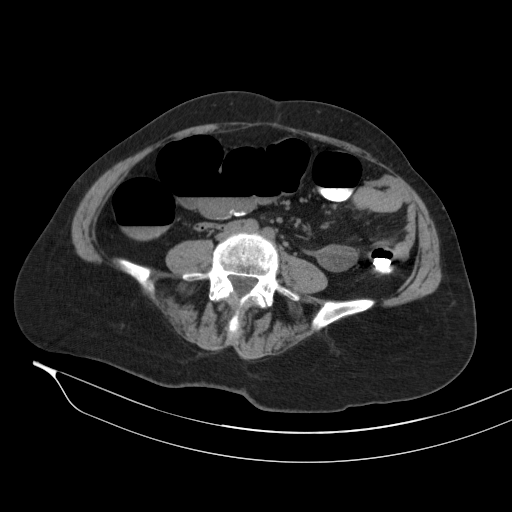
[im 97/161  soft-tissue]
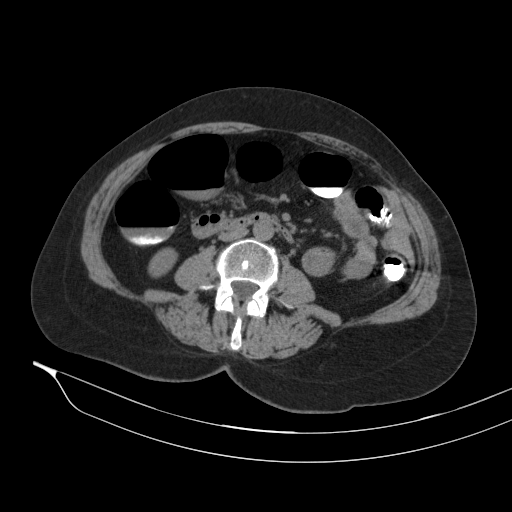
[im 107/161  soft-tissue]
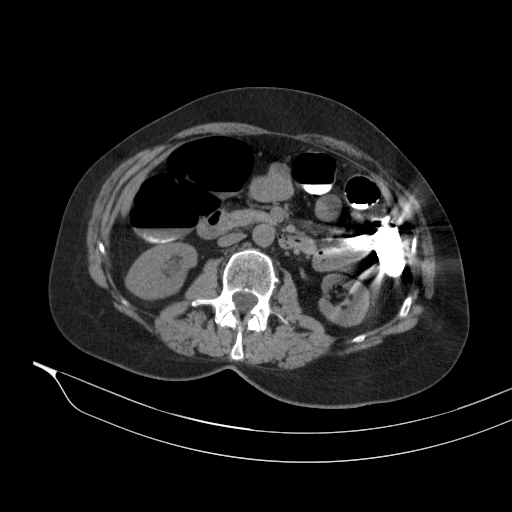
[im 107/161  bone]
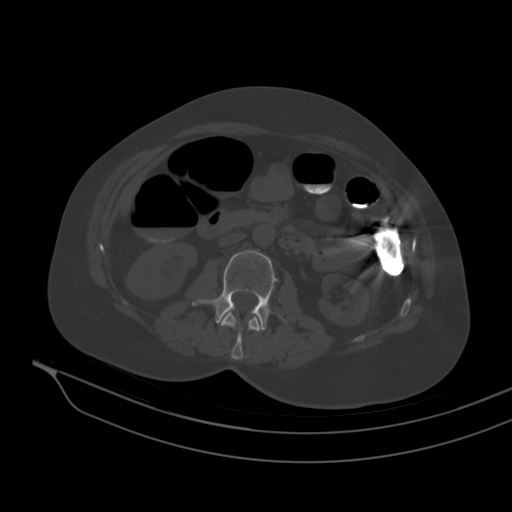
[im 129/161  soft-tissue]
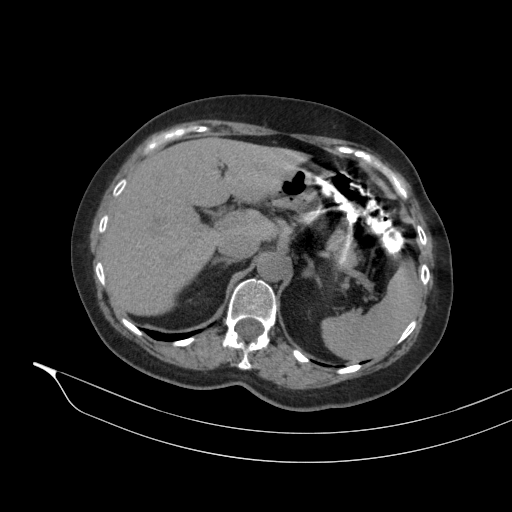
[im 139/161  soft-tissue]
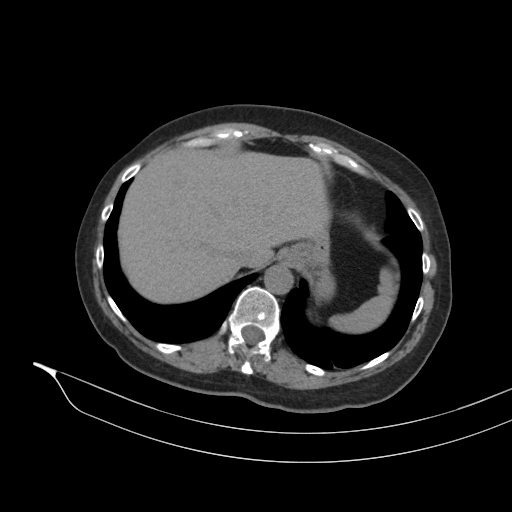
[im 150/161  soft-tissue]
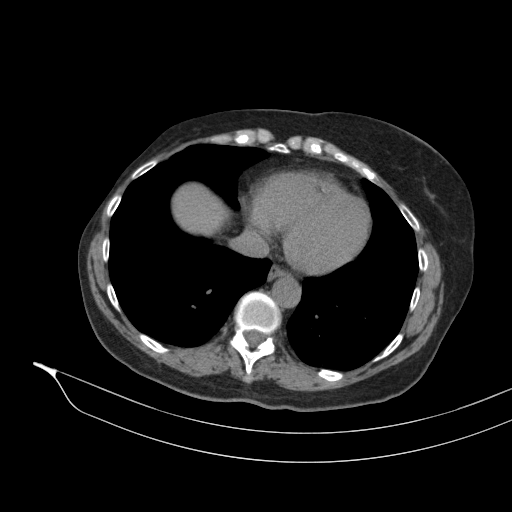

[Series 8: colo_prone coronal · coronal · 0.73mm/px · 3 of 108 slices shown]
[im 22/108  soft-tissue]
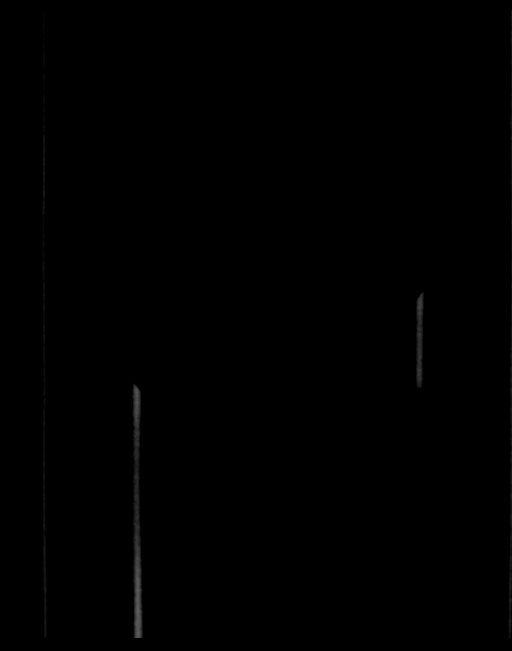
[im 43/108  soft-tissue]
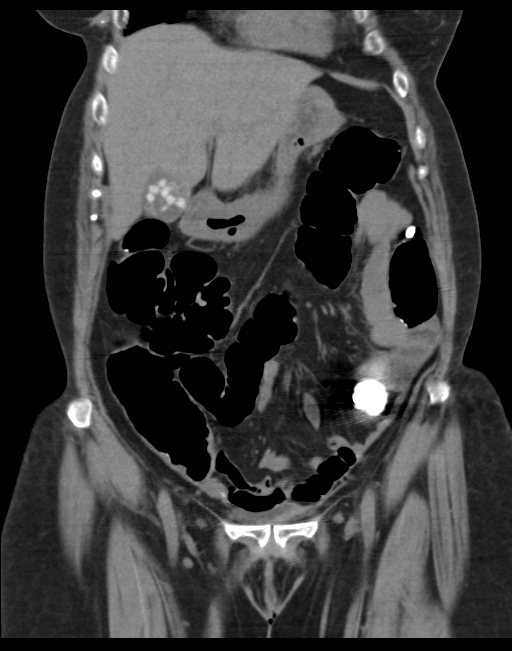
[im 65/108  soft-tissue]
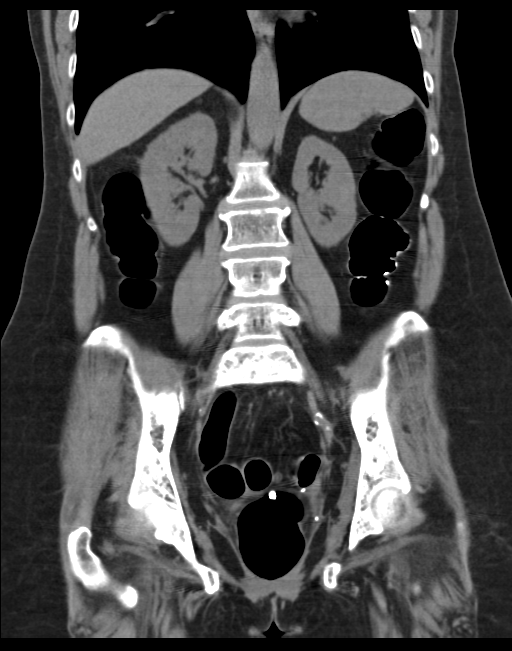

[15 of 46 positions shown; findings below may reference images not displayed]

FINDINGS: Minor linear scarring versus atelectasis in the left lower lobe. There 
are multiple gallstones in the gallbladder. The liver, spleen, pancreas, 
adrenals and kidneys are normal. Abdominal aorta has mild atherosclerotic 
changes with no aneurysm. 
On CT the pelvis the urinary bladder is normal. The uterus is been removed. No 
pelvic mass or adenopathy. No free fluid in pelvis. 
On virtual colonoscopy there is residual layering Tegretol seen in the 
descending colon, transverse colon and ascending colon and cecum limiting 
portions of the study. In addition there is decompressed sigmoid colon and 
splenic flexure on supine images which have normal appearance on prone images. 
No definite obstruction of mass or polyp greater than 10 mm seen on visualized 
portions of the colon.
IMPRESSION: Portions of vertebral colonoscopy study limited due to residual Tegretol in the 
colon and portions of sigmoid colon and splenic flexure are incompletely 
distended on supine images but better distended on prone images. No visualized 
mass, obstruction or polyp greater than 10 mm identified on Kobayashi 
colonoscopy. It should be noted that pulse measuring less than 10 mm may not be 
seen on vertebral colonoscopy. 
Multiple gallstones in gallbladder without inflammatory changes. 
RADIATION DOSE REDUCTION: All CT scans are performed using radiation dose 
reduction techniques, when applicable.  Technical factors are evaluated and 
adjusted to ensure appropriate moderation of exposure.  Automated dose 
management technology is applied to adjust the radiation doses to minimize 
exposure while achieving diagnostic quality images.

## 2021-02-25 IMAGING — MR MRI ABDOMEN W/WO CONTRAST WITH MRCP
19 of 24 series · 36 of 48 positions shown · IV contrast (gadavist)
Comparison: none

________________________________________________________________________________________________ 
MRI ABDOMEN W/WO CONTRAST WITH MRCP, 02/25/2021 [DATE]: 
CLINICAL INDICATION:  Family history of pancreatic cancer
TECHNIQUE: Multiplanar, multiacquisition MR images of the abdomen were 
performed without and with intravenous contrast enhancement including dynamic 
imaging.  MRCP sequences were performed with post processing. 7.5 ccs of 
Gadavist was injected intravenously 1.5 cc/s.

[Series 201: survey bh navi · axial · 15.0mm · 1.70mm/px · z∈[+92,+207]mm · 2 of 11 slices shown]
[im 1/11]
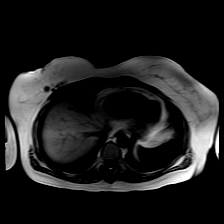
[im 11/11]
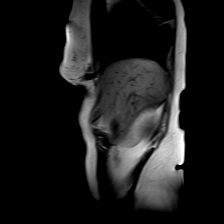

[Series 301: (id)* · coronal · 5.0mm · 0.68mm/px · 1 of 30 slices shown]
[im 1/30]
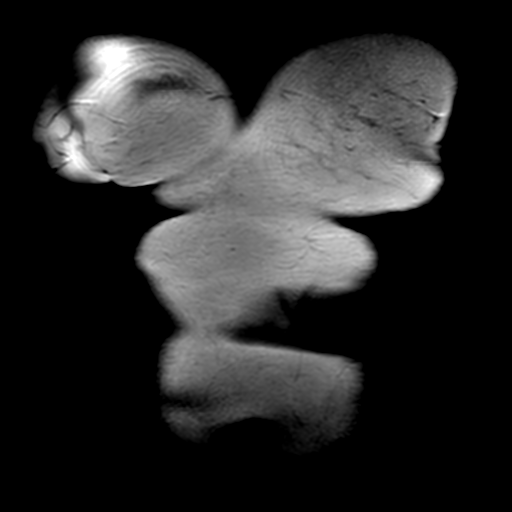

[Series 402: sout of phase · axial · 6.0mm · 1.15mm/px · 1 of 32 slices shown]
[im 1/32]
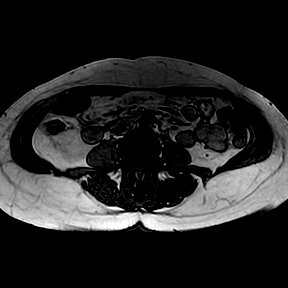

[Series 403: sin phase · axial · 6.0mm · 1.15mm/px · 1 of 32 slices shown]
[im 1/32]
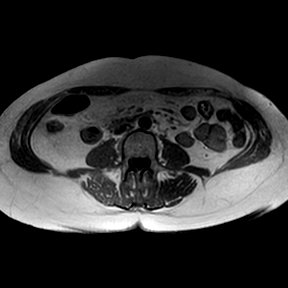

[Series 501: t2w_tse-navi · axial · 5.0mm · 0.74mm/px · 1 of 36 slices shown]
[im 1/36]
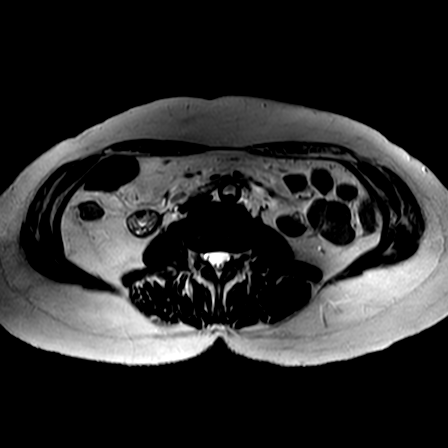

[Series 601: t2w_spair-panc · axial · 3.2mm · 0.78mm/px · 1 of 36 slices shown]
[im 1/36]
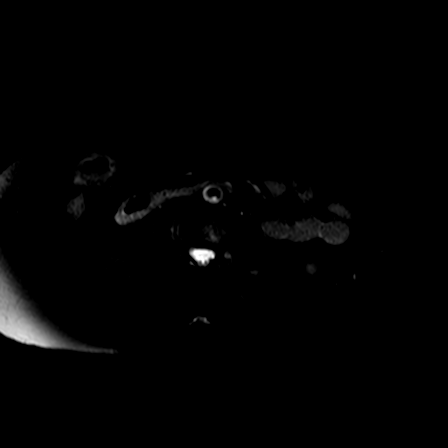

[Series 701: 3d_grase_bh · coronal · 2.7mm · 0.78mm/px · 2 of 55 slices shown]
[im 1/55]
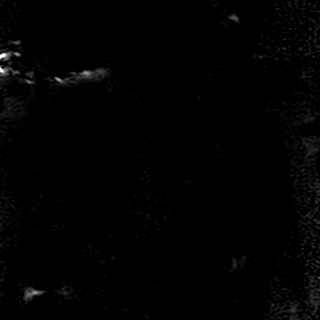
[im 55/55]
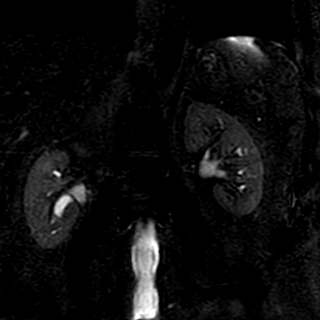

[Series 801: ssh_mrcprad · coronal · 40.0mm · 0.59mm/px · 1 of 6 slices shown]
[im 1/6]
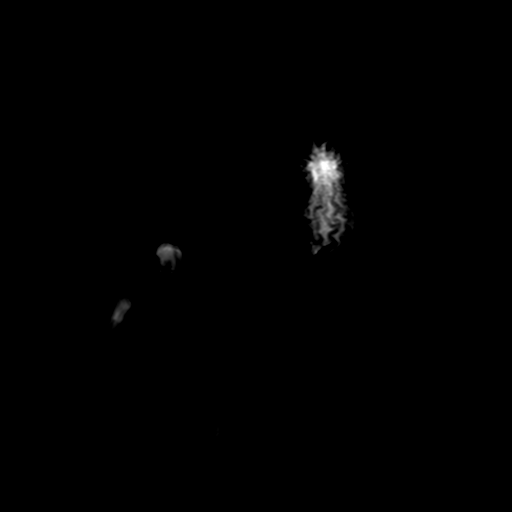

[Series 901: dwi_3b_nav · axial · 5.0mm · 1.61mm/px · z∈[-74,+168]mm · 3 of 90 slices shown]
[im 1/90]
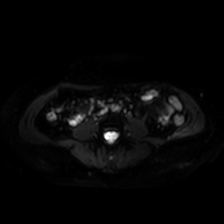
[im 45/90]
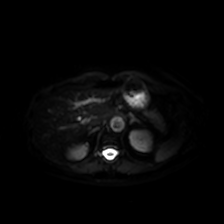
[im 90/90]
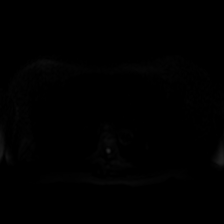

[Series 902: dadc 600 · axial · 5.0mm · 1.61mm/px · 1 of 45 slices shown]
[im 1/45]
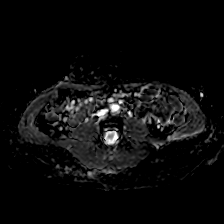

[Series 903: sb0 · axial · 5.0mm · 1.61mm/px · 1 of 45 slices shown]
[im 1/45]
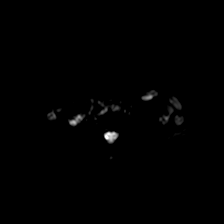

[Series 904: (id) · axial · 5.0mm · 1.61mm/px · 1 of 45 slices shown]
[im 1/45]
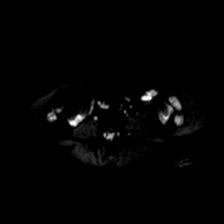

[Series 1002: DIXON · axial · 3.5mm · 0.86mm/px · z∈[-65,+144]mm · 3 of 120 slices shown (1 of 5)]
[im 1/120]
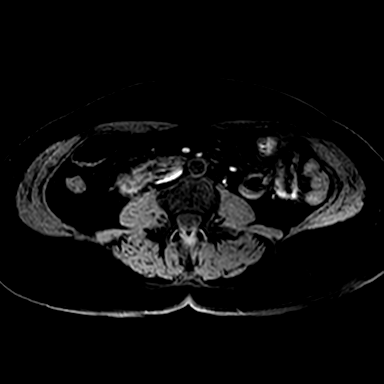
[im 60/120]
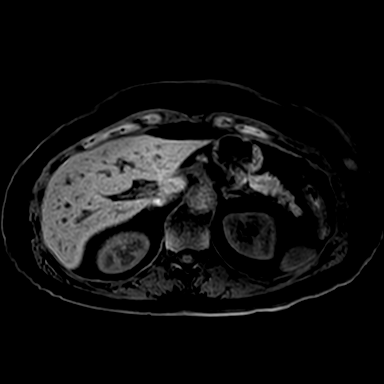
[im 120/120]
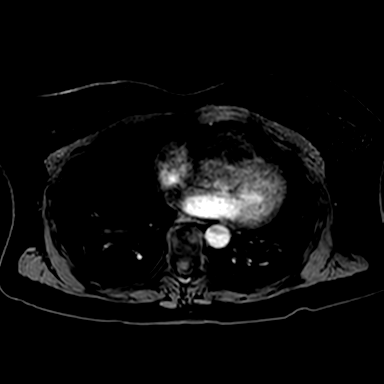

[Series 1003: DIXON · axial · 3.5mm · 0.86mm/px · z∈[-65,+144]mm · 3 of 120 slices shown (2 of 5)]
[im 1/120]
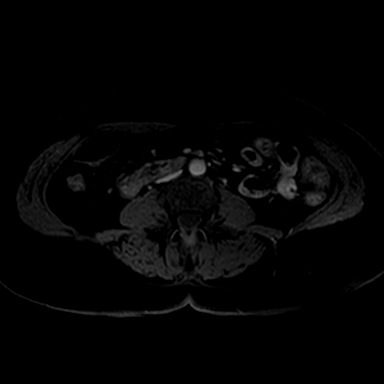
[im 60/120]
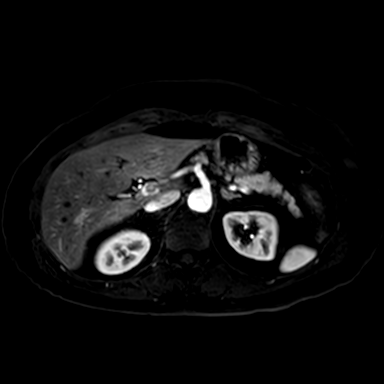
[im 120/120]
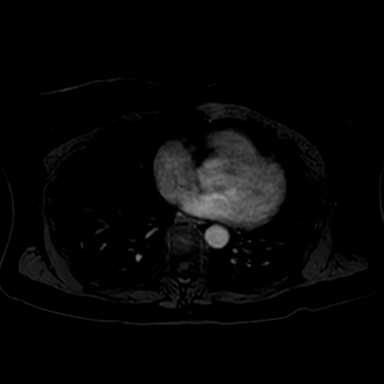

[Series 1004: DIXON · axial · 3.5mm · 0.86mm/px · z∈[-65,+144]mm · 3 of 120 slices shown (3 of 5)]
[im 1/120]
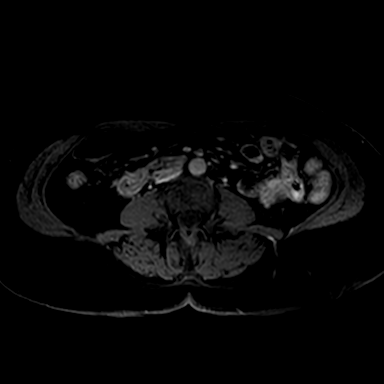
[im 60/120]
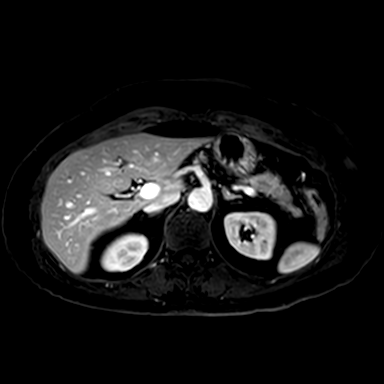
[im 120/120]
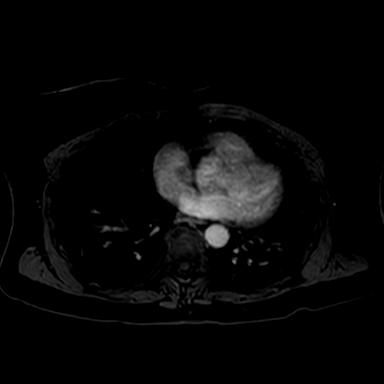

[Series 1005: DIXON · axial · 3.5mm · 0.86mm/px · z∈[-65,+144]mm · 3 of 120 slices shown (4 of 5)]
[im 1/120]
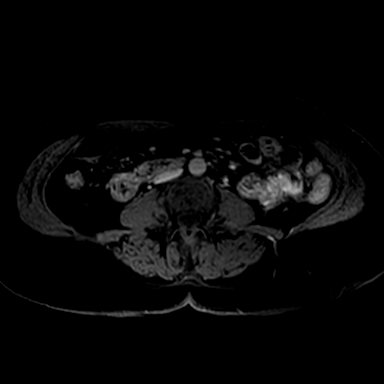
[im 60/120]
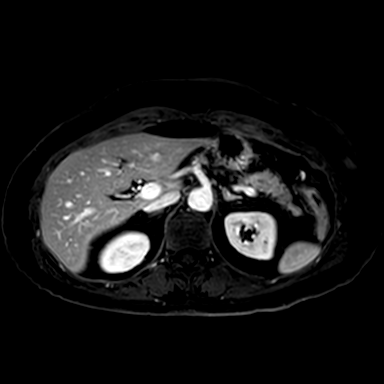
[im 120/120]
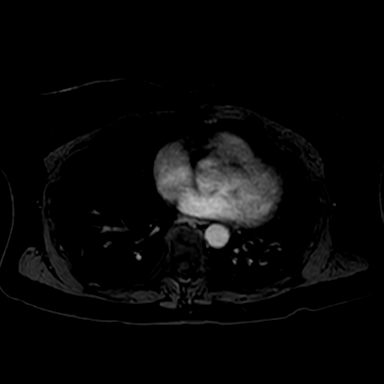

[Series 1006: DIXON · axial · 3.5mm · 0.86mm/px · z∈[-65,+144]mm · 3 of 120 slices shown (5 of 5)]
[im 1/120]
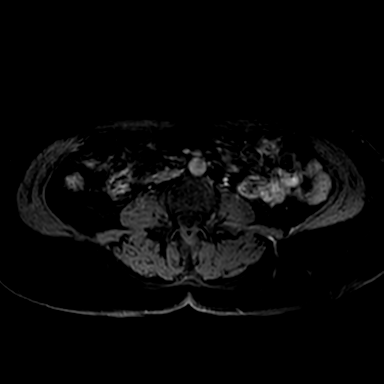
[im 60/120]
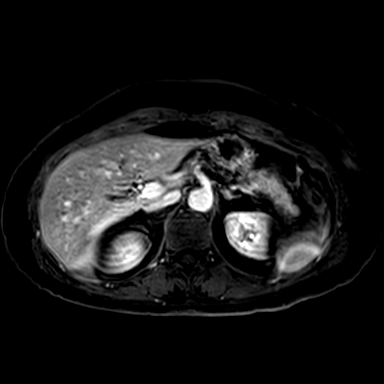
[im 120/120]
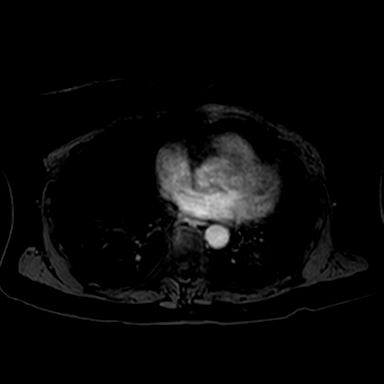

[Series 1007: DIXON post-contrast · axial · 3.5mm · 0.86mm/px · z∈[-65,+144]mm · 3 of 120 slices shown (1 of 2)]
[im 1/120]
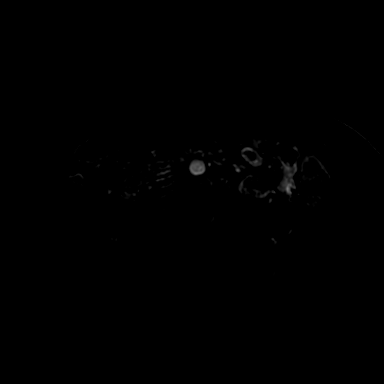
[im 60/120]
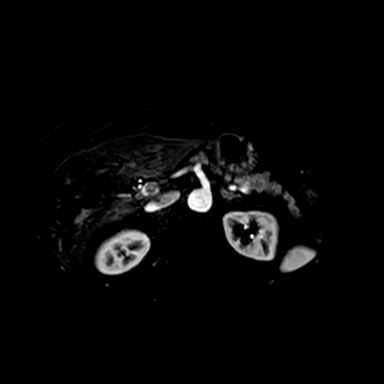
[im 120/120]
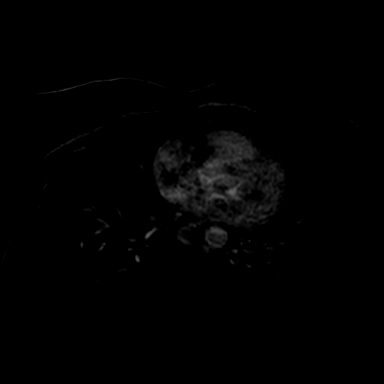

[Series 1008: DIXON post-contrast · axial · 3.5mm · 0.86mm/px · z∈[-65,+39]mm · 2 of 120 slices shown (2 of 2)]
[im 1/120]
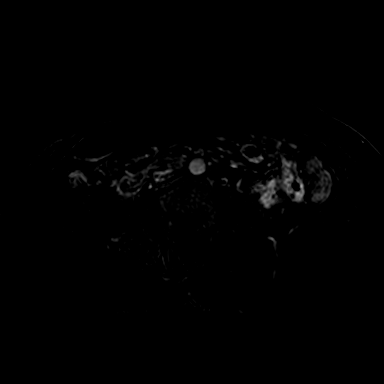
[im 60/120]
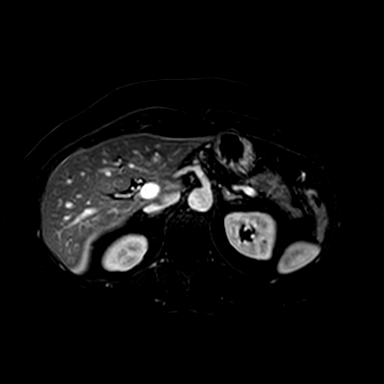

[36 of 48 positions shown; findings below may reference images not displayed]

FINDINGS: On axial T2 image 9 and the 3 mm axial SPIR image 18 there is an 
approximately 10 x 8 mm septated cyst or small focus of clustered cysts in the 
body of the pancreas. There is no associated enhancement on the dynamic 
sequences or 5 minute delayed enhanced image. The main pancreatic duct diameter 
is no greater than 2 mm, within normal limits. No other pancreatic cyst. There 
is no pancreatic enhancement elsewhere. No peripancreatic fluid collection. The 
portal, splenic and superior mesenteric veins are open. 
There is no diffusion restriction within the pancreatic cystic focus. 
There are multiple layering gallstones. The common bile duct diameter is no 
greater than 4 mm, within normal limits. 
There are several 2 mm cysts in the left hepatic lobe, lacking enhancement. 4 mm 
right renal cyst. There is no splenic or adrenal lesion. Kidneys excrete 
contrast symmetrically. Mesenteric arteries are open. There is no 
retroperitoneal or intra-abdominal adenopathy. No layering pleural fluid. 
In phase out of phase sequences show no significant hepatic fatty infiltration.
IMPRESSION: 10 x 8 mm septated cyst or clustered small cysts in the body of the pancreas 
likely represents intraductal papillary mucinous neoplasm. Further follow-up per 
protocol below recommended. 
Several 2 mm left hepatic lobe cysts, most likely benign findings. 4 mm right 
renal cyst appears benign. 
No other visceral mass or cyst. No adenopathy, peripancreatic fluid collection, 
or ascites. 
Cholelithiasis. Common bile duct shows normal diameter. 
REFERENCE: 
International Consensus Guidelines for Management of IPMN and LORRAINE of the 
Pancreas. 
Pancreatology 12 [phone]. 
Asymptomatic patient without high-risk stigmata of malignancy (obstructive 
jaundice with cystic lesion in head of pancreas, enhancing solid component 
within cyst, or main pancreatic duct greater than 10 mm), and without worrisome 
features (cyst greater than 3cm, thickened/enhancing cyst walls, main pancreatic 
duct 5-9 mm, mural nodule, or abrupt change in caliber of pancreatic duct with 
distal pancreatic atrophy). 
Size of largest cyst: 
Less than 1 cm: CT or MRI with contrast in 2-3 years. 
1-2 cm: CT or MRI with contrast yearly for 2 years, then lengthen interval if no 
change. 
2-3 cm: EUS in 3-6 months, then can alternate MRI with EUS as appropriate.  
Greater than 3 cm: Close surveillance alternating MRI with EUS every 3-6 months. 
Consider surgery in young, fit patients. 
Consider Gastroenterology consultation for all categories of pancreatic cysts.

## 2021-03-12 IMAGING — MR MRI BRAIN WITHOUT CONTRAST
5 of 10 series · 25 of 48 positions shown · IV contrast (gadolinium)
Comparison: None

________________________________________________________________________________________________ 
MRI BRAIN WITHOUT CONTRAST, 03/12/2021 [DATE]: 
CLINICAL INDICATION: 65-year-old female with right breast cancer. Cervical 
cancer 9885. History of migraines for 30 years. Vertigo.
TECHNIQUE: Multiplanar, multiecho position MR images of the brain were performed 
without intravenous gadolinium enhancement. Patient was scanned on a 3T magnet.

[Series 401: FLAIR · axial · 5.0mm · 0.60mm/px · z∈[-100,+53]mm · 3 of 27 slices shown]
[im 1/27]
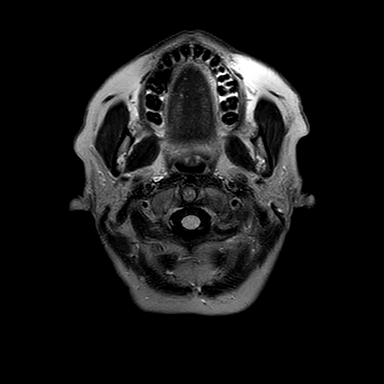
[im 14/27]
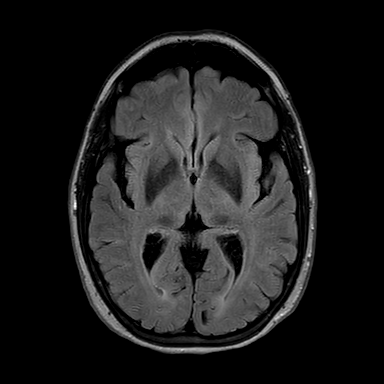
[im 27/27]
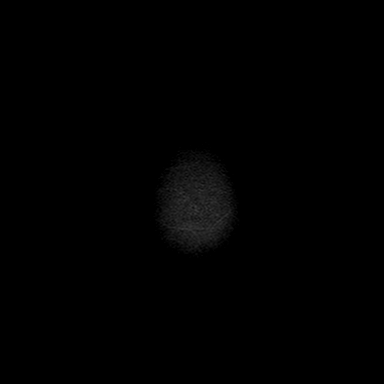

[Series 601: SWI · axial · 3.0mm · 0.53mm/px · z∈[-96,+50]mm · 9 of 100 slices shown (1 of 2)]
[im 1/100]
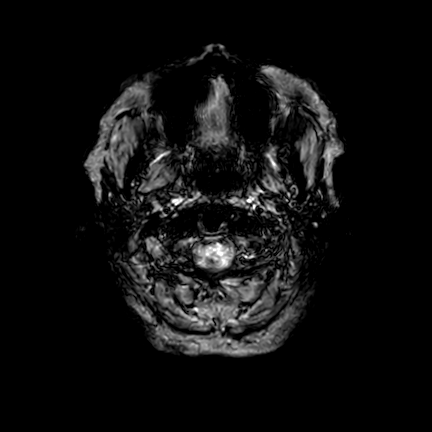
[im 17/100]
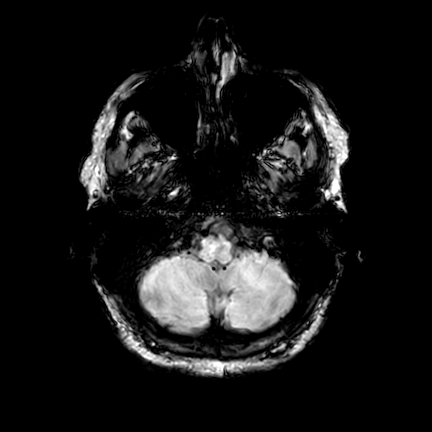
[im 34/100]
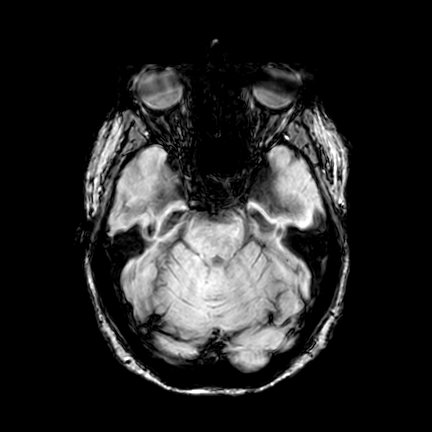
[im 42/100]
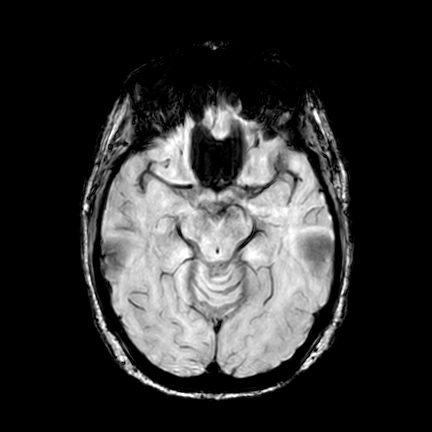
[im 50/100]
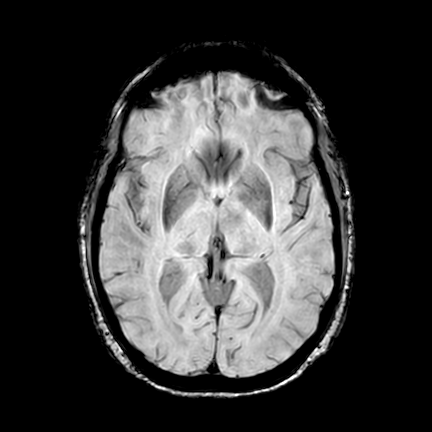
[im 58/100]
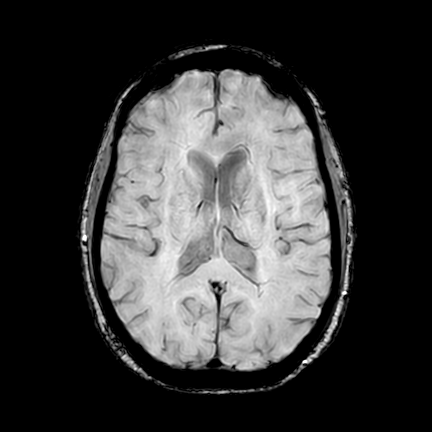
[im 67/100]
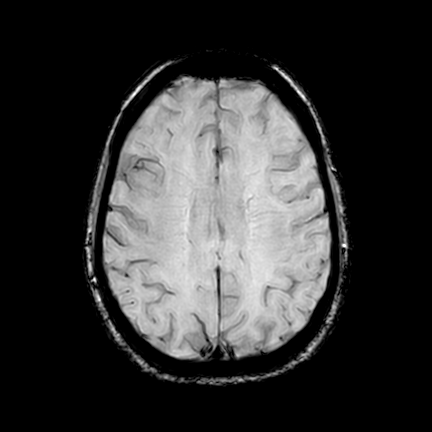
[im 83/100]
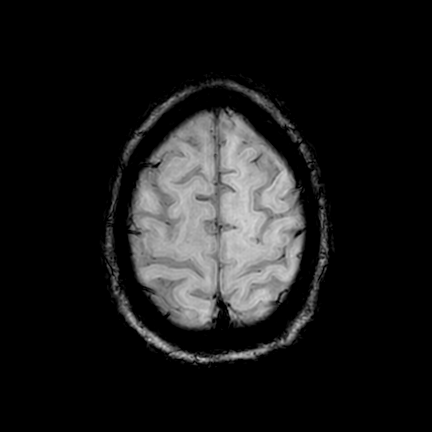
[im 100/100]
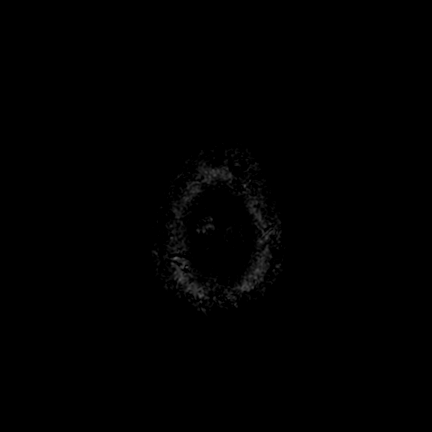

[Series 602: SWI · axial · 10.0mm · 0.53mm/px · z∈[-97,-14]mm · 5 of 78 slices shown (2 of 2)]
[im 1/78]
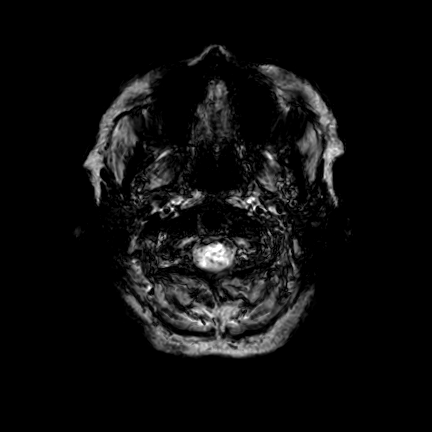
[im 9/78]
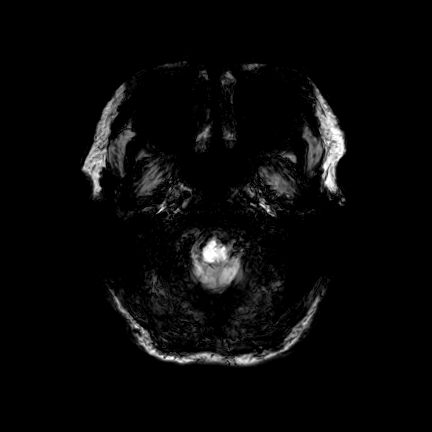
[im 26/78]
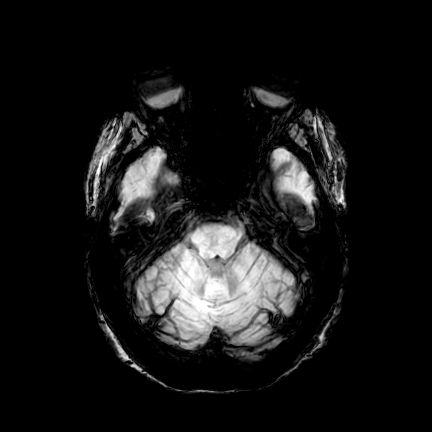
[im 35/78]
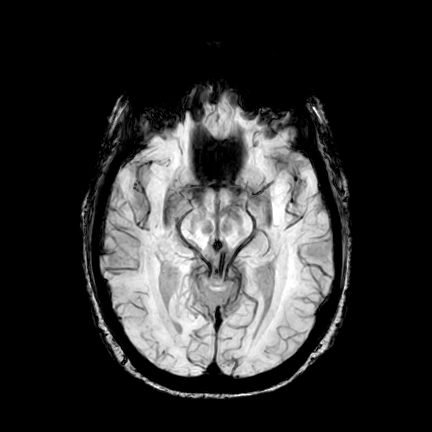
[im 43/78]
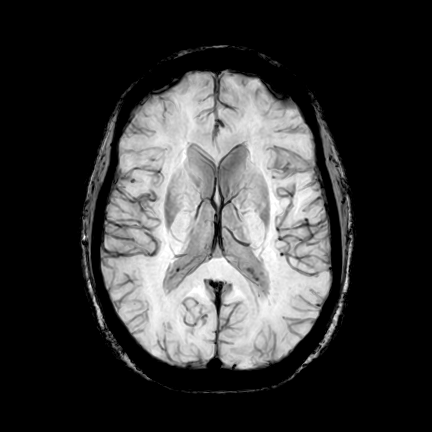

[Series 701: T2 · axial · 5.0mm · 0.41mm/px · z∈[-100,+53]mm · 3 of 27 slices shown (1 of 2)]
[im 1/27]
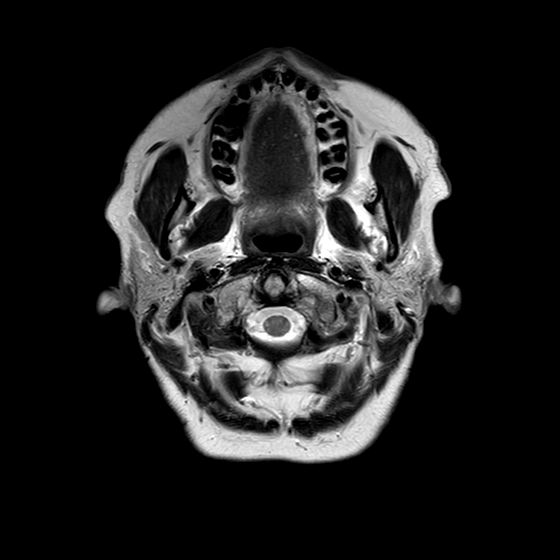
[im 14/27]
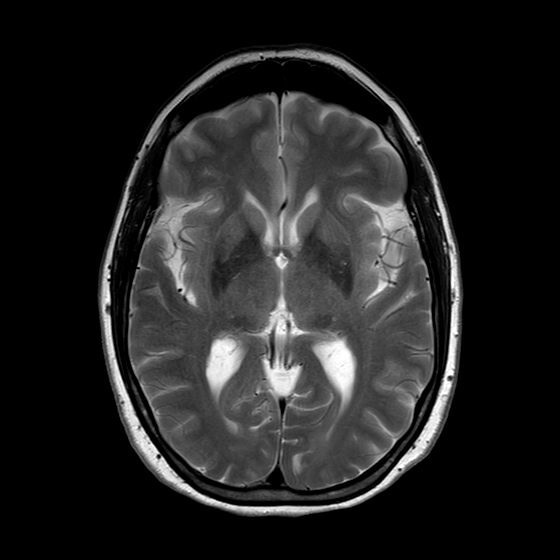
[im 27/27]
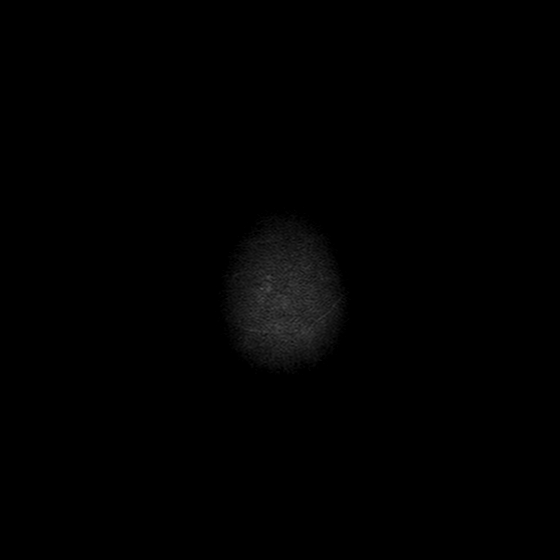

[Series 801: T2 · coronal · 4.0mm · 0.47mm/px · 5 of 36 slices shown (2 of 2)]
[im 1/36]
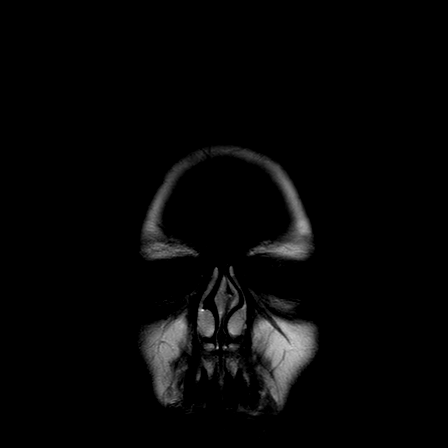
[im 9/36]
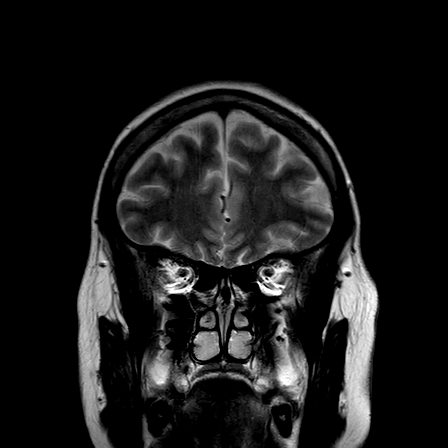
[im 18/36]
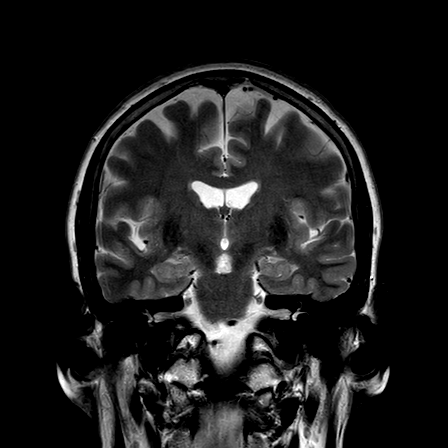
[im 27/36]
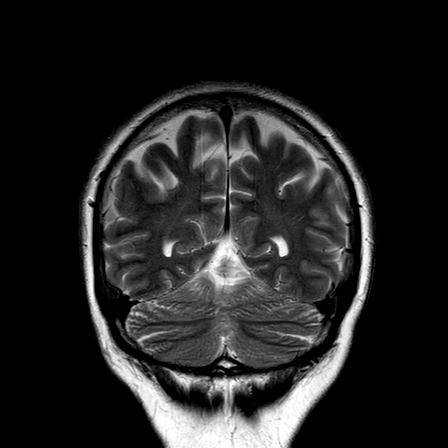
[im 36/36]
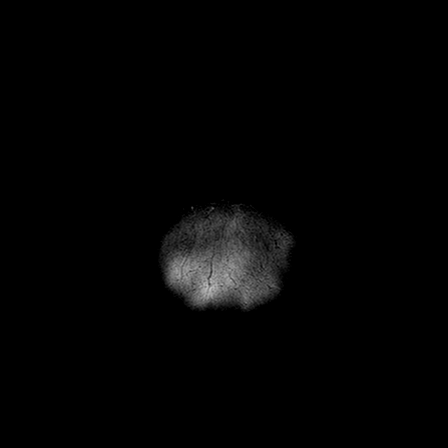

[25 of 48 positions shown; findings below may reference images not displayed]

FINDINGS: Intracranial contents: No acute or subacute infarct. No intracranial blood 
products of any type. The sella is unremarkable, given limitations of technique. 
Normally positioned cerebellar tonsils. There is mild/moderate degree of 
nonspecific confluent periventricular and deep white matter T2 FLAIR 
hyperintensity, likely reflective of chronic small vessel vascular change. Mild 
generalized brain volume loss without lobar specific pattern. No hydrocephalus. 
No mass, mass effect, midline shift, or abnormal extra-axial fluid collection. 
Skull base vascular flow voids are preserved. 
Orbits, sinuses, and temporal bones: No acute orbital abnormality. Membrane 
thickening occludes a single right mid ethmoid air cell. No mastoid or middle 
ear effusion. 
Bones and soft tissues: 13 mm subcutaneous soft tissue nodule left occipital 
region posteriorly, image 5 series 701 indeterminate but likely reflective of an 
incidental nonenlarged subcutaneous lymph node. No soft tissue swelling. Normal 
marrow signal.
IMPRESSION: No acute intracranial process. No mass or mass effect. 
Mild-to-moderate presumed chronic small vessel vascular changes as detailed 
above. 
Minimal right mid ethmoid sinusitis.

## 2021-04-01 IMAGING — CT CT CALCIUM SCORING
1 series · 15 of 20 positions shown, 19 images · non-contrast
Comparison: There are no previous exams available for comparison.

________________________________________________________________________________________________ 
CT CALCIUM SCORING, 04/01/2021 [DATE]:
INDICATION: Evaluate coronary artery calcification.

[Series 2: cascoreseq 3.0 b35s 60% · axial · 0.42mm/px · z∈[-256,-134]mm · 15 of 91 slices shown, 19 images]
[im 5/91  vessel]
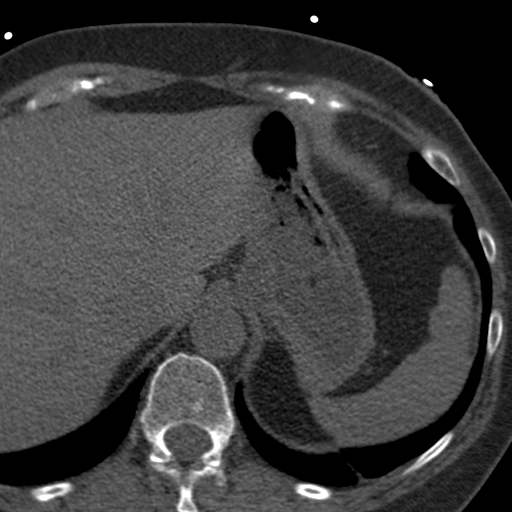
[im 5/91  lung]
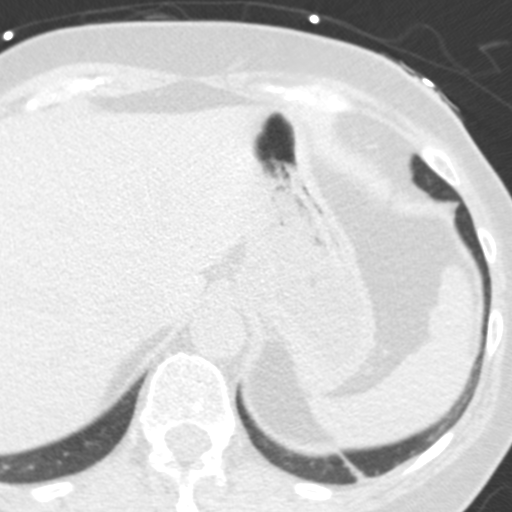
[im 10/91  vessel]
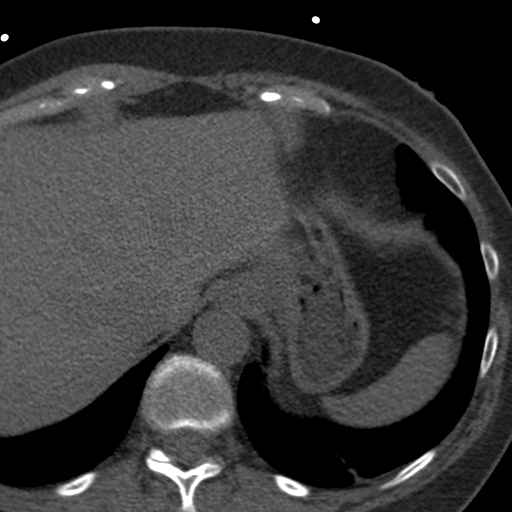
[im 19/91  vessel]
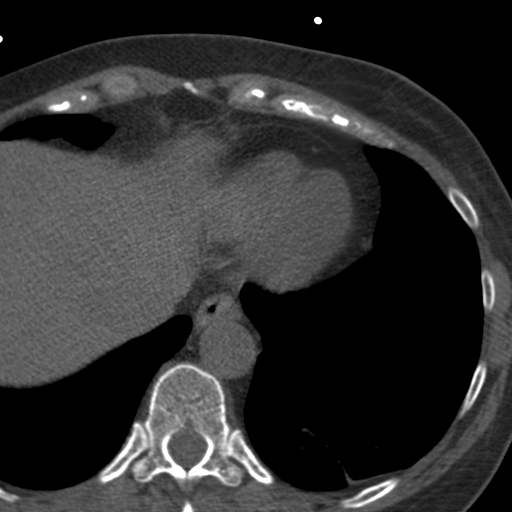
[im 24/91  vessel]
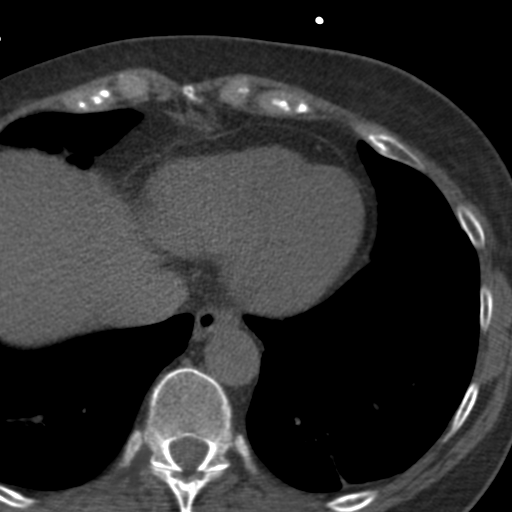
[im 29/91  vessel]
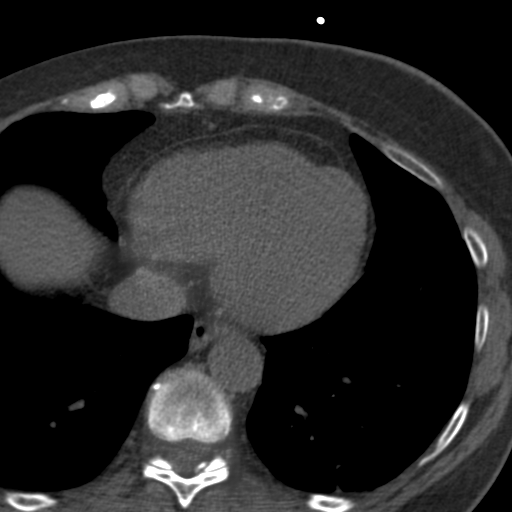
[im 29/91  lung]
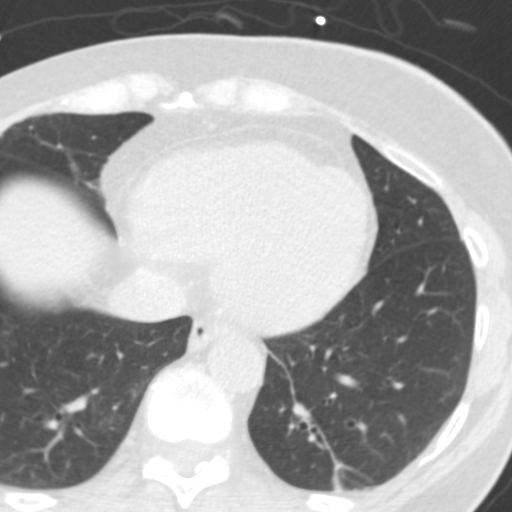
[im 34/91  vessel]
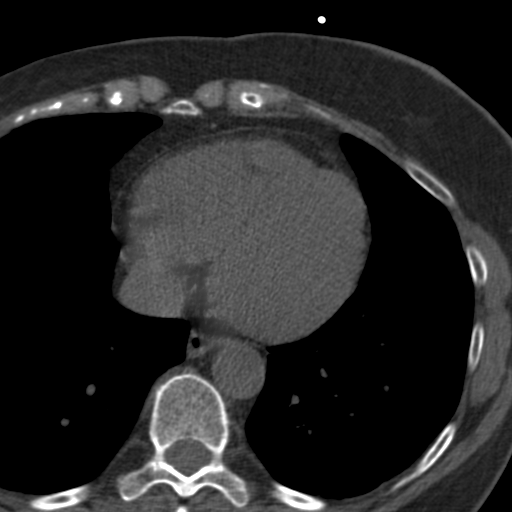
[im 38/91  vessel]
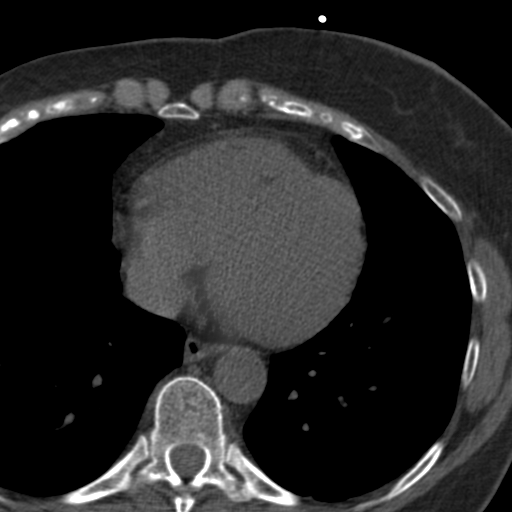
[im 48/91  vessel]
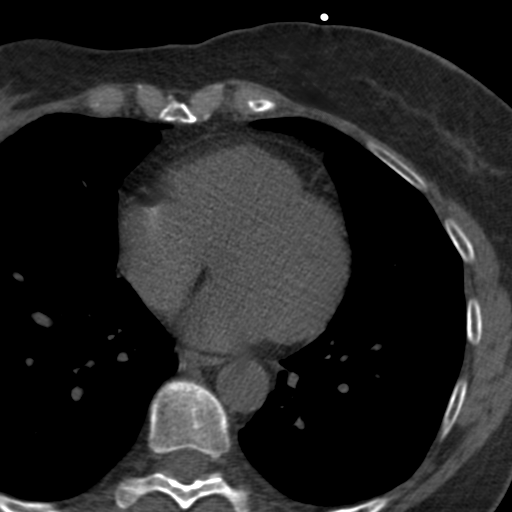
[im 53/91  vessel]
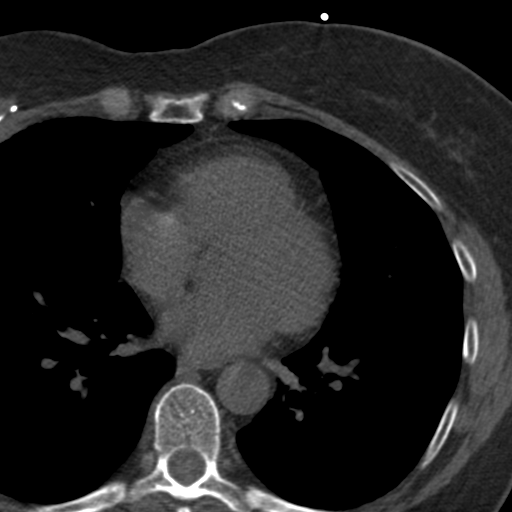
[im 53/91  lung]
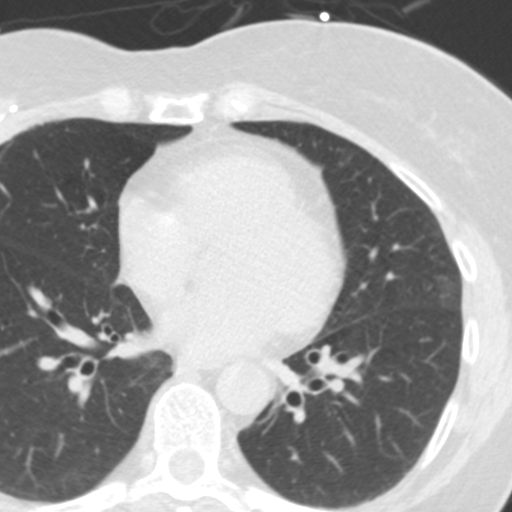
[im 57/91  vessel]
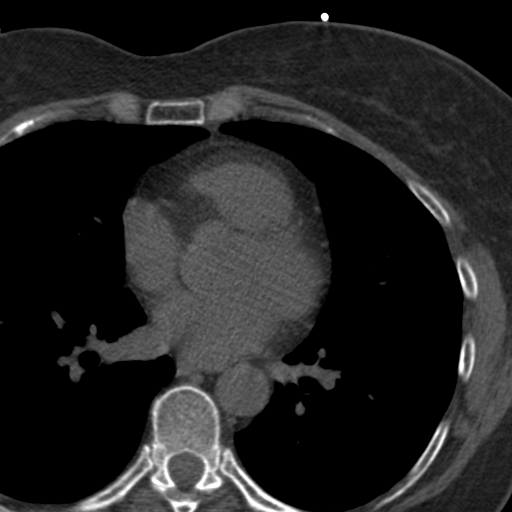
[im 62/91  vessel]
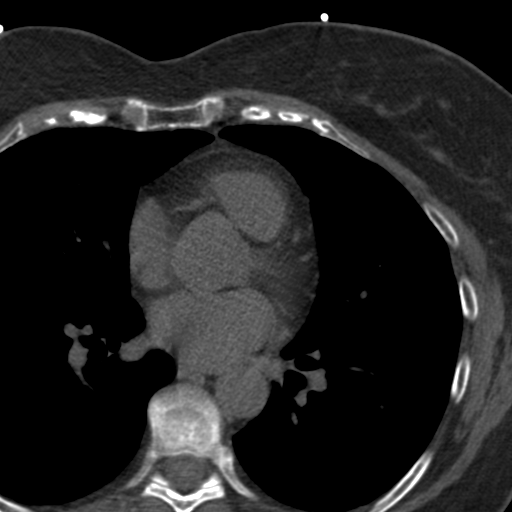
[im 67/91  vessel]
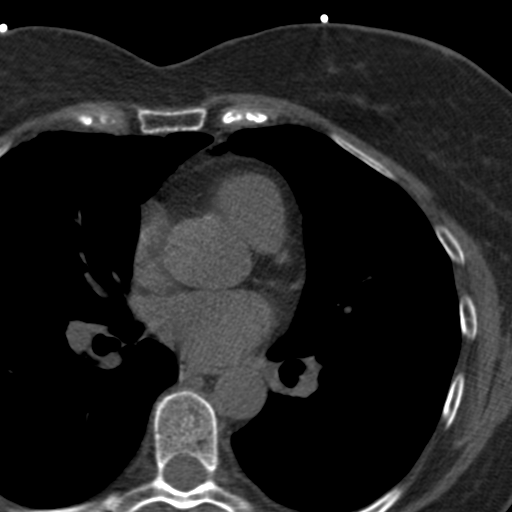
[im 76/91  vessel]
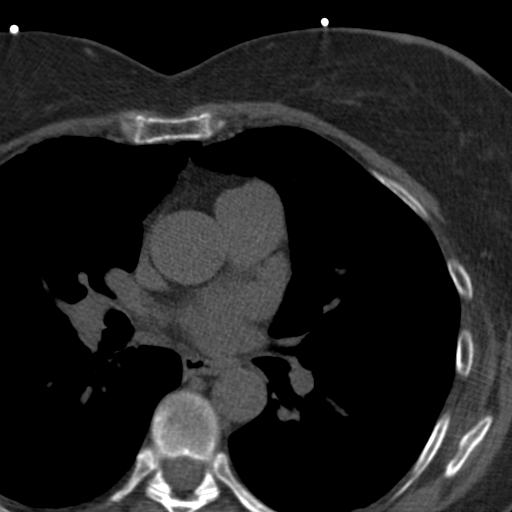
[im 76/91  lung]
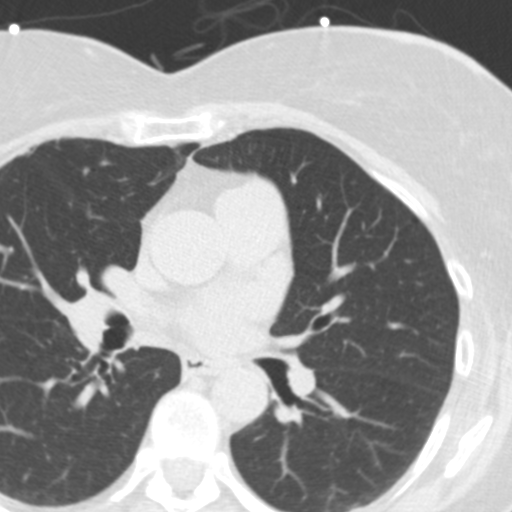
[im 81/91  vessel]
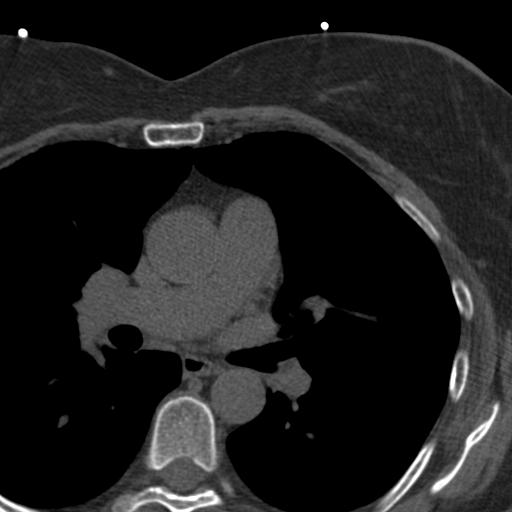
[im 86/91  vessel]
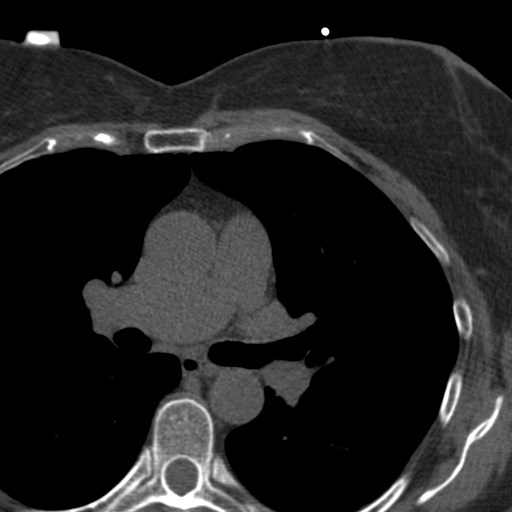

[15 of 20 positions shown; findings below may reference images not displayed]

FINDINGS: Visualized lungs are clear. Mild atelectatic changes left lower lobe.
IMPRESSION: Total Calcium Score: 0 
No ancillary findings. 
Calcium score                                     Presence of Plaque 
0                                                    No evidence of plaque 
1-10                                               Minimal evidence of plaque 
11-100                                            Mild evidence of plaque 
101-400                                          Moderate evidence of plaque 
Over 400                                        Extensive evidence of plaque     

Calcium scoring sheets to follow. 
RADIATION DOSE REDUCTION: All CT scans are performed using radiation dose 
reduction techniques, when applicable.  Technical factors are evaluated and 
adjusted to ensure appropriate moderation of exposure.  Automated dose 
management technology is applied to adjust the radiation doses to minimize 
exposure while achieving diagnostic quality images.

## 2021-06-02 IMAGING — MR MRI BRAIN WITH  IAC W/WO CONTRAST
11 of 17 series · 24 of 48 positions shown · IV contrast (gadavist)
Comparison: MRI brain from March 12, 2021.

________________________________________________________________________________________________ 
MRI BRAIN WITH  IAC W/WO CONTRAST,06/02/2021 [DATE]: 
CLINICAL INDICATION: Dizziness and giddiness.
TECHNIQUE: MRI brain without and with contrast utilizing IAC protocol. 7 mL of 
Gadavist were injected intravenously by hand. .5 mL of Gadavist were discarded. 
Patient was scanned on a 3T magnet.

[Series 101: survey · axial · 10.0mm · 0.98mm/px · 1 of 5 slices shown]
[im 1/5]
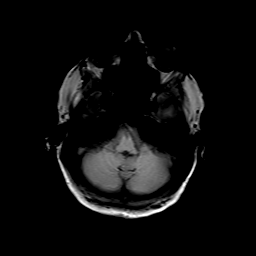

[Series 201: t1_se_sag · sagittal · 4.0mm · 0.51mm/px · 1 of 27 slices shown]
[im 1/27]
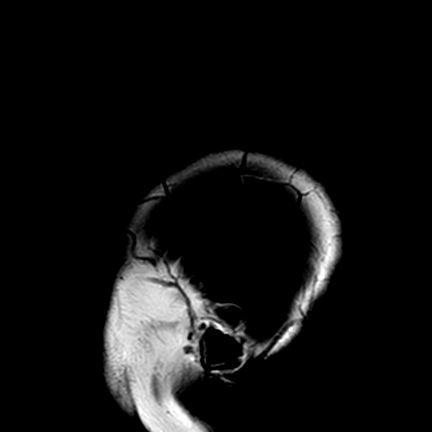

[Series 301: t1w_ffe ax · axial · 5.0mm · 0.41mm/px · 1 of 27 slices shown]
[im 1/27]
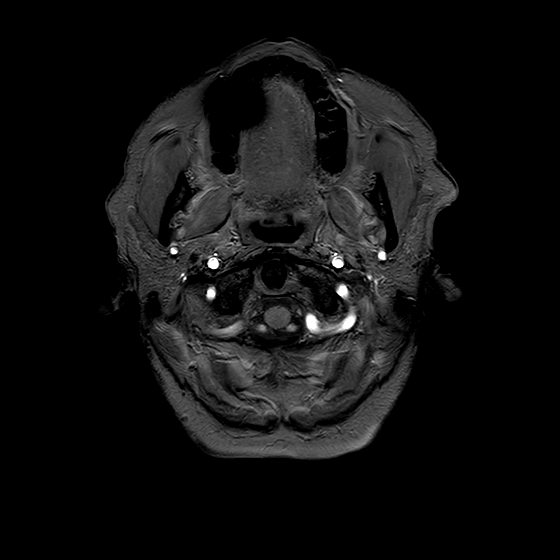

[Series 401: FLAIR · axial · 5.0mm · 0.65mm/px · 1 of 27 slices shown]
[im 1/27]
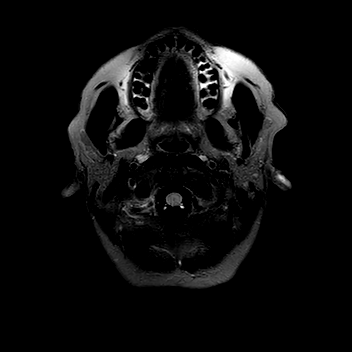

[Series 503: dadc map · axial · 5.0mm · 1.03mm/px · 1 of 27 slices shown]
[im 1/27]
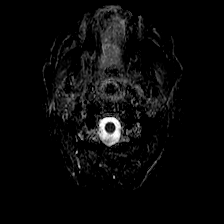

[Series 505: (id) · axial · 5.0mm · 1.03mm/px · z∈[-47,+106]mm · 2 of 27 slices shown]
[im 1/27]
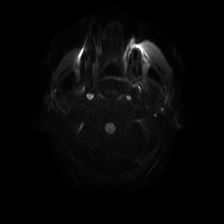
[im 27/27]
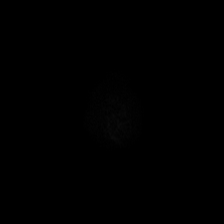

[Series 601: SWI · axial · 3.0mm · 0.53mm/px · z∈[-44,+103]mm · 6 of 100 slices shown (1 of 2)]
[im 1/100]
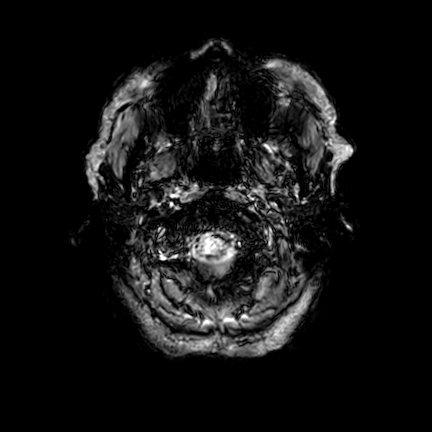
[im 20/100]
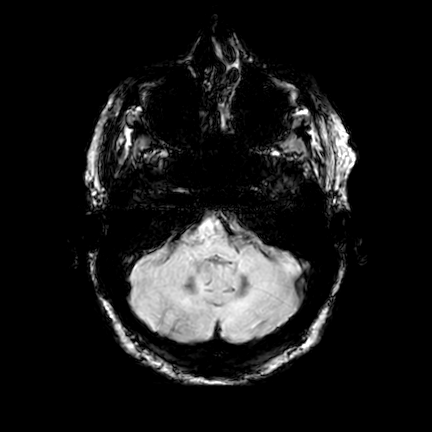
[im 40/100]
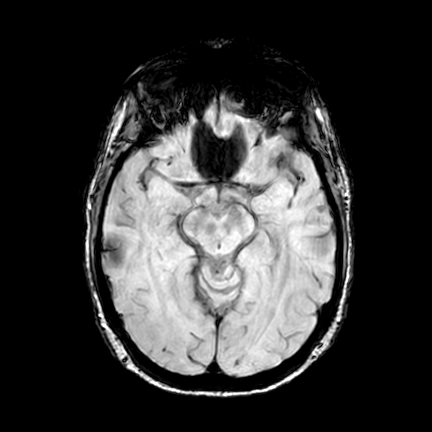
[im 60/100]
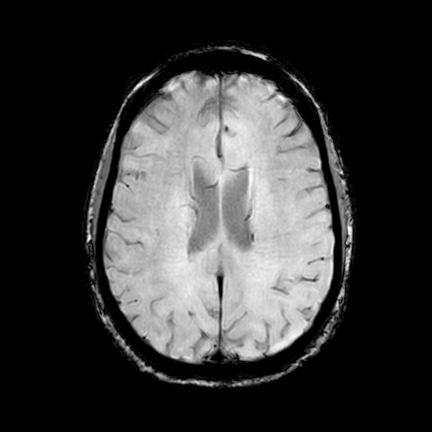
[im 80/100]
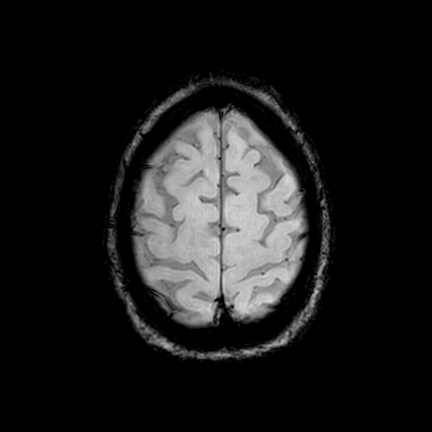
[im 100/100]
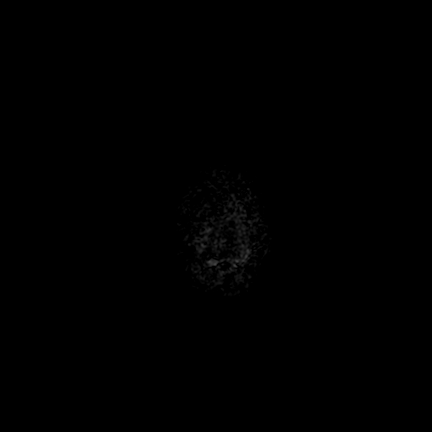

[Series 602: SWI · axial · 10.0mm · 0.53mm/px · z∈[-44,+107]mm · 5 of 78 slices shown (2 of 2)]
[im 1/78]
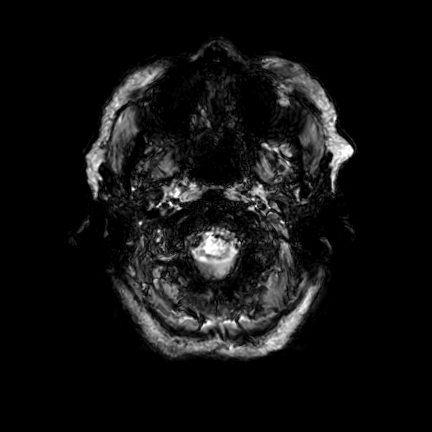
[im 20/78]
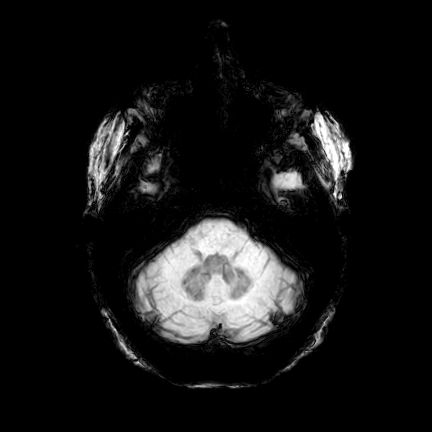
[im 39/78]
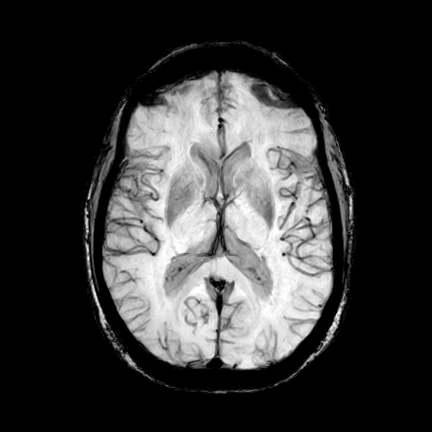
[im 58/78]
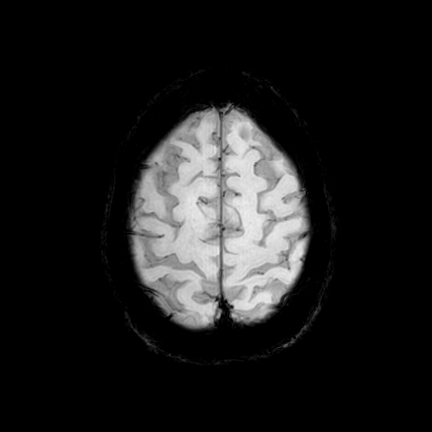
[im 78/78]
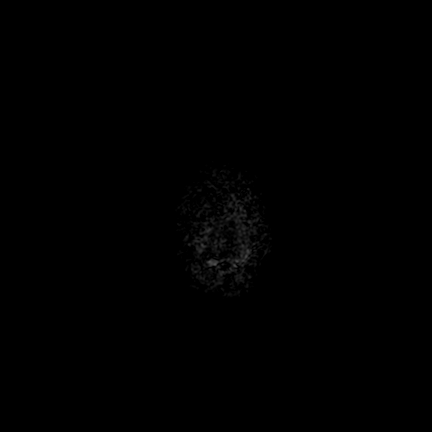

[Series 801: T2 · axial · 5.0mm · 0.41mm/px · z∈[-47,+106]mm · 2 of 27 slices shown]
[im 1/27]
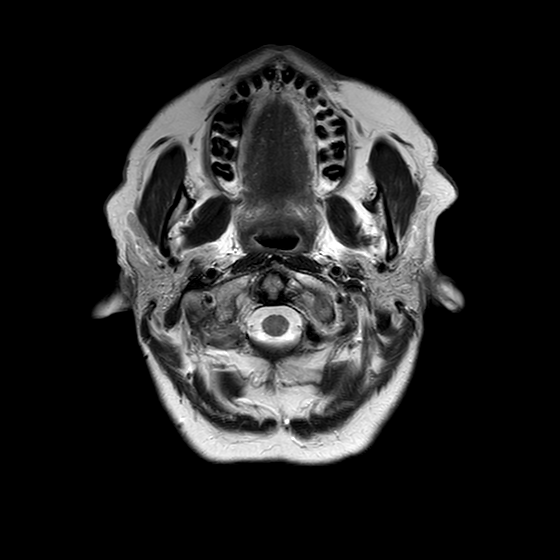
[im 27/27]
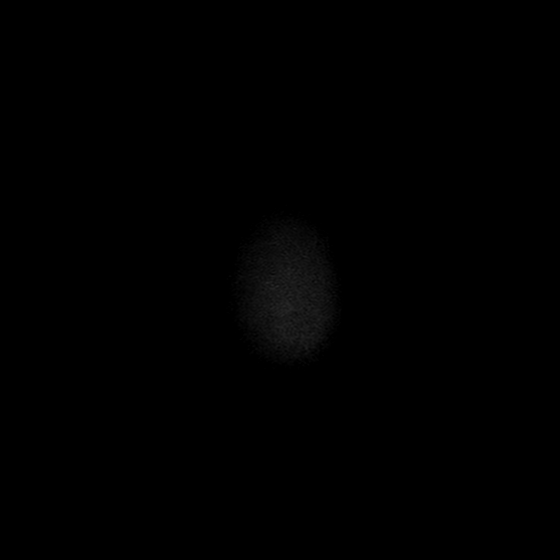

[Series 1101: T1 post-contrast · coronal · 4.0mm · 0.38mm/px · 2 of 36 slices shown (1 of 2)]
[im 1/36]
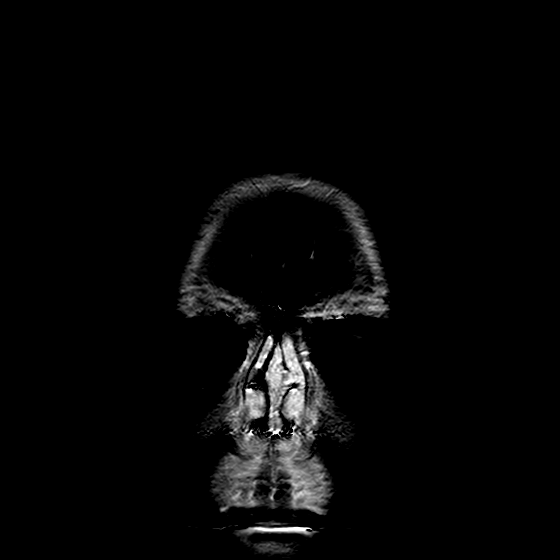
[im 36/36]
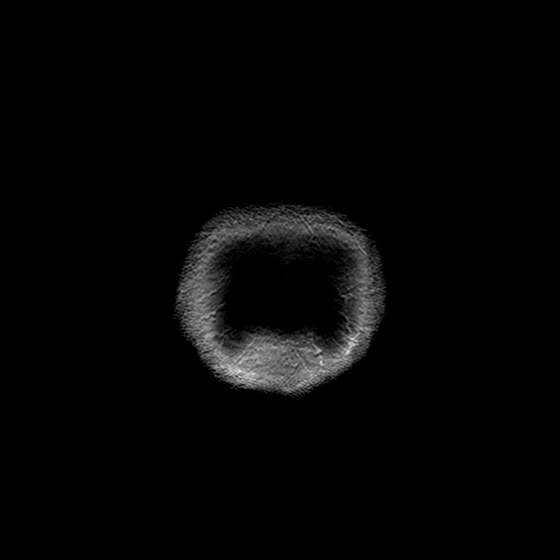

[Series 1201: T1 post-contrast · sagittal · 4.0mm · 0.34mm/px · 2 of 36 slices shown (2 of 2)]
[im 1/36]
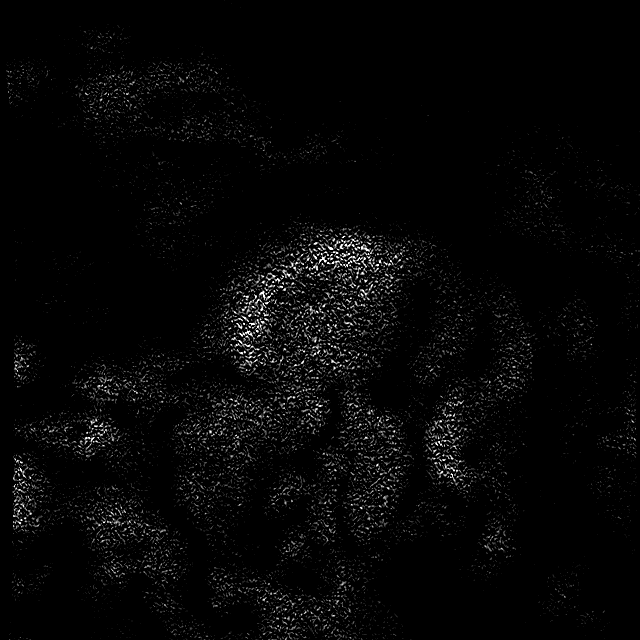
[im 36/36]
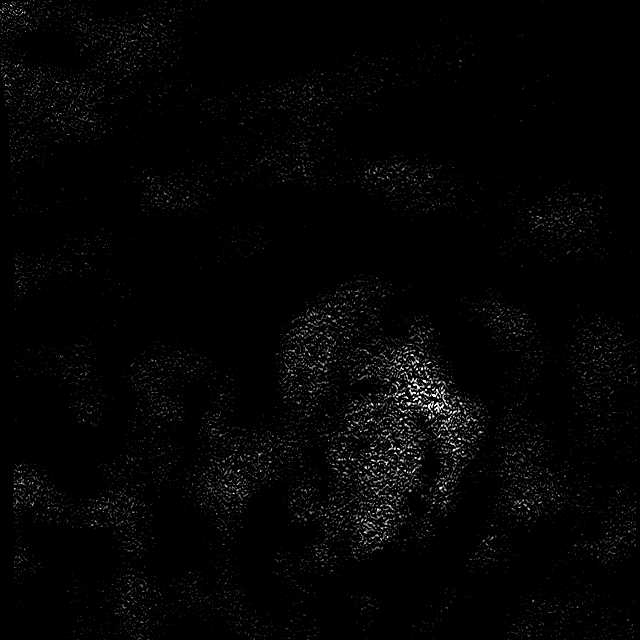

[24 of 48 positions shown; findings below may reference images not displayed]

FINDINGS: --------------------------------------------------------------------------- 
Intracranial: 
No acute ischemia. No abnormal foci of susceptibility artifact in the brain. 
Periventricular and deep white matter change, probably secondary to 
microangiopathy. This is stable. No pathologic enhancement in the brain. 
--------------------------------------------------------------------------- 
IACs: 
No mass or pathologic enhancement along either internal auditory canal. 
--------------------------------------------------------------------------- 
Extracranial: 
Visualized parapharyngeal soft tissues and orbits show no focal abnormality. 
Mild mucosal changes ethmoid air cells 
---------------------------------------------------------------------------
IMPRESSION: No acute abnormality, mass, pathologic enhancement in the brain or internal 
auditory canals.  White matter microangiopathic changes.

## 2021-10-13 IMAGING — DX KNEE 4 VIEWS LEFT
1 series · 4 of 4 positions shown · non-contrast
Comparison: None.

________________________________________________________________________________________________ 
KNEE 4 VIEWS LEFT, 10/13/2021 [DATE]: 
CLINICAL INDICATION: Left knee pain..

[Series 1: AP · U · 0.14mm/px · 4 of 4 slices shown]
[im 1/4]
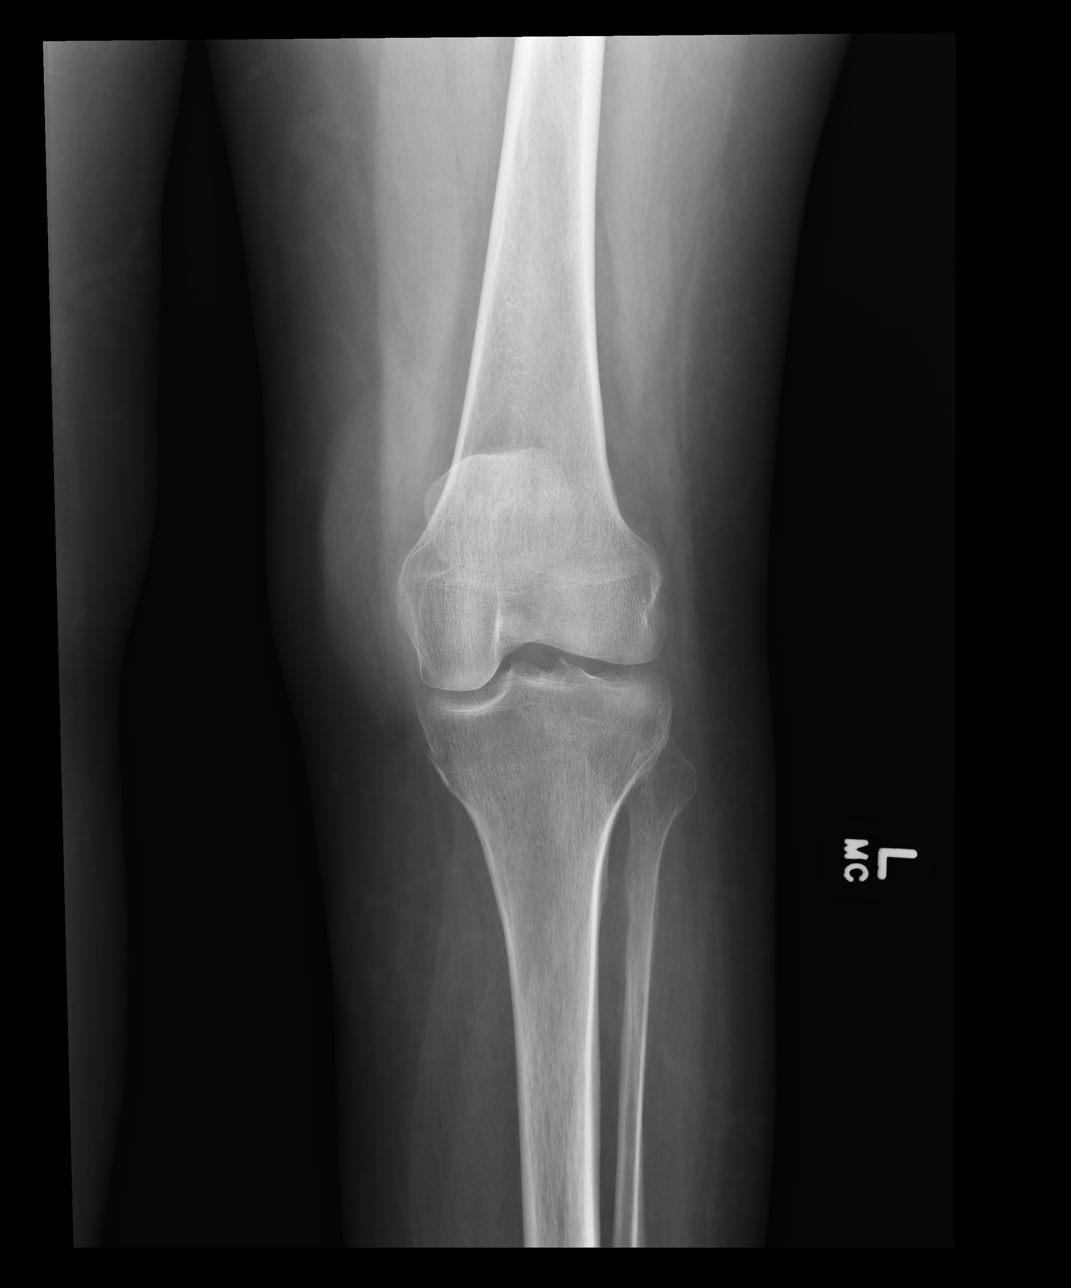
[im 2/4]
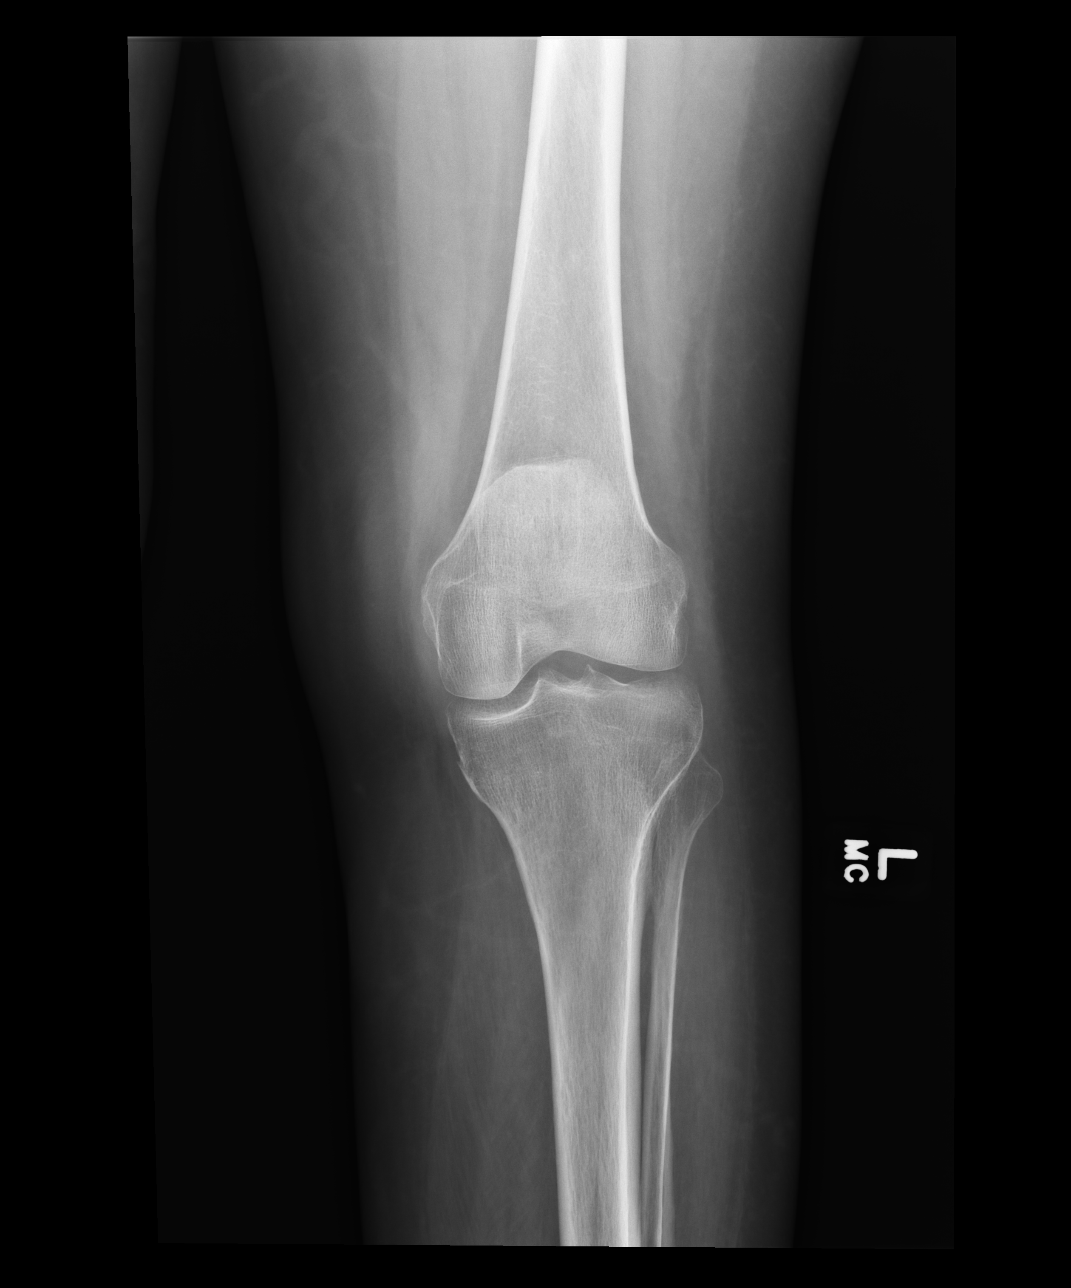
[im 3/4]
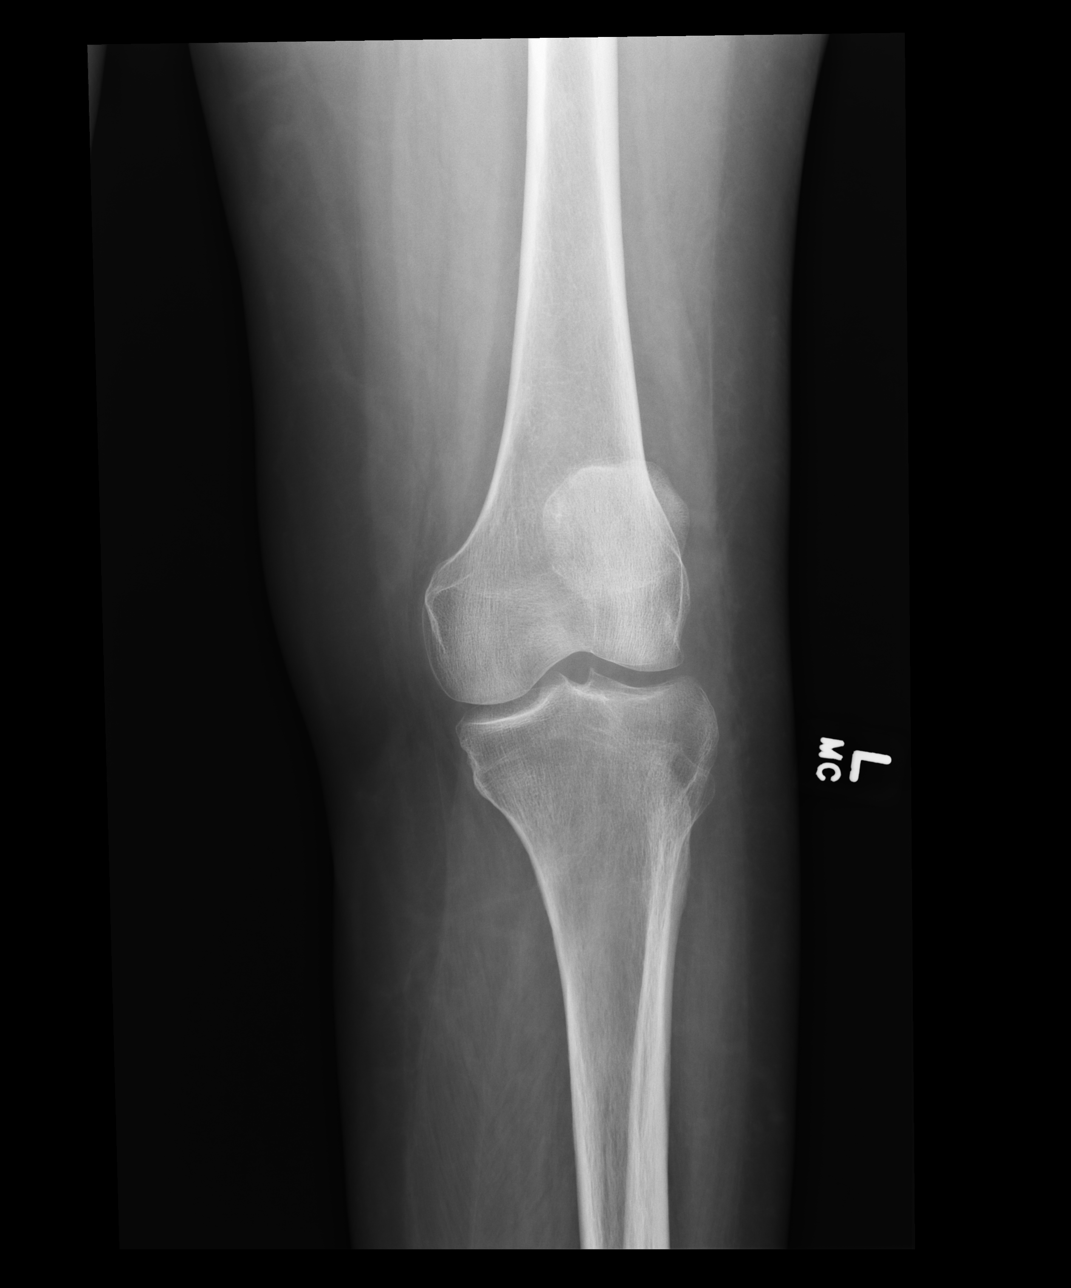
[im 4/4]
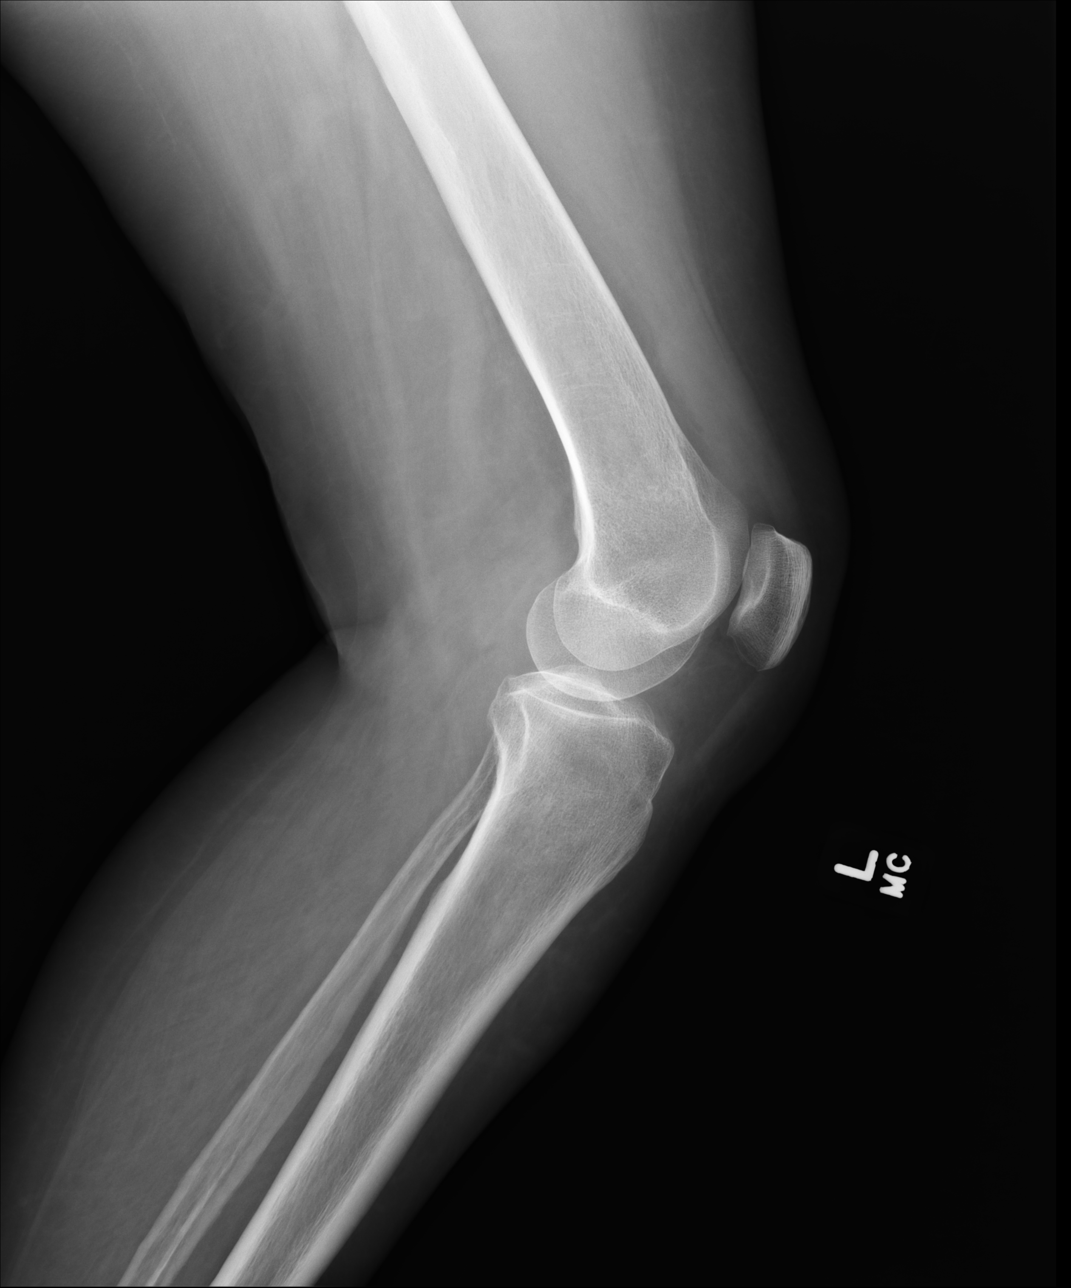

[4 of 4 positions shown; findings below may reference images not displayed]

FINDINGS: No fracture. Normal alignment. Joint spaces are preserved. Small knee 
joint effusion. No focal soft tissue swelling.
IMPRESSION: Small knee joint effusion. No acute osseous abnormality. If symptoms persist, 
consideration could be made for MR exam.

## 2021-11-07 IMAGING — MR MRI LEFT KNEE WITHOUT CONTRAST
4 of 5 series · 19 of 40 positions shown · IV contrast (gadolinium)
Comparison: 10/13/21 radiographs

________________________________________________________________________________________________ 
MRI LEFT KNEE WITHOUT CONTRAST, 11/07/2021 [DATE]: 
CLINICAL INDICATION: Ttransient synovitis, left knee. Chronic left knee pain.
TECHNIQUE: Multiplanar, multiecho position MR images of the knee were performed 
without intravenous gadolinium enhancement. Patient was scanned on a 3T magnet.

[Series 201: survey_left · axial · 10.0mm · 0.94mm/px · z∈[-40,+150]mm · 3 of 15 slices shown]
[im 1/15]
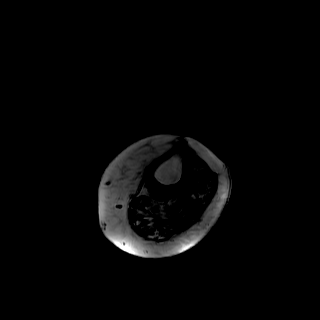
[im 8/15]
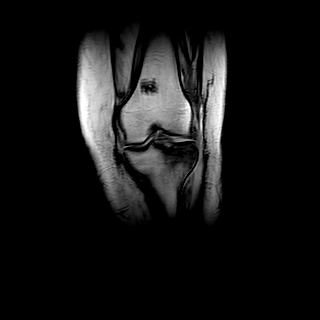
[im 15/15]
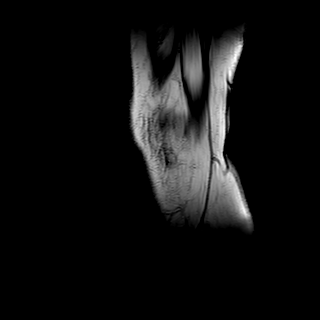

[Series 301: pdw spair ax · axial · 3.0mm · 0.29mm/px · z∈[-50,+65]mm · 5 of 38 slices shown]
[im 1/38]
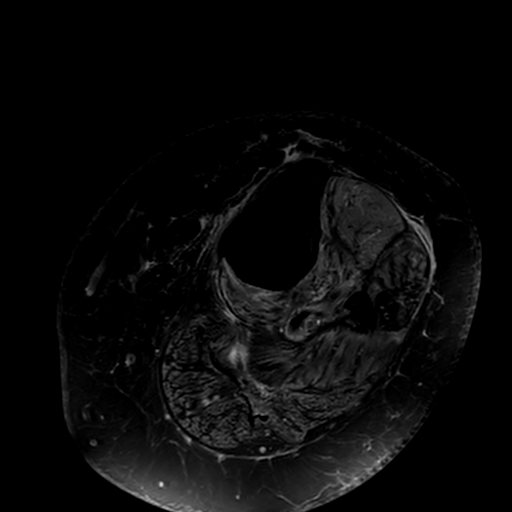
[im 5/38]
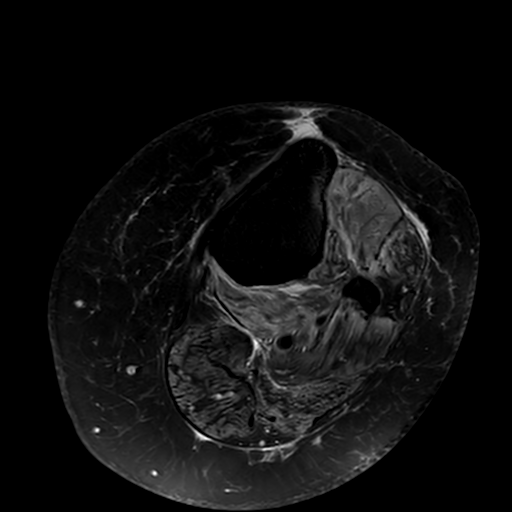
[im 10/38]
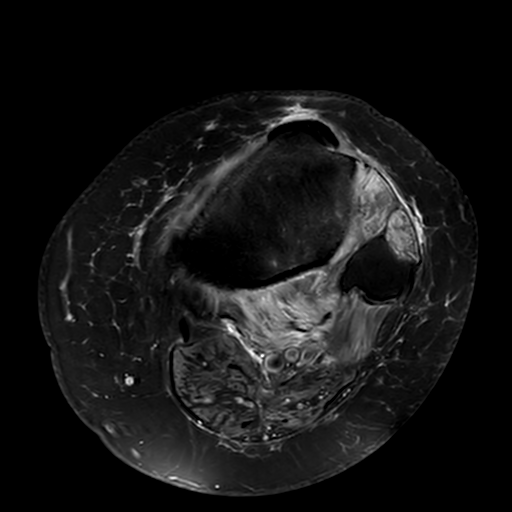
[im 19/38]
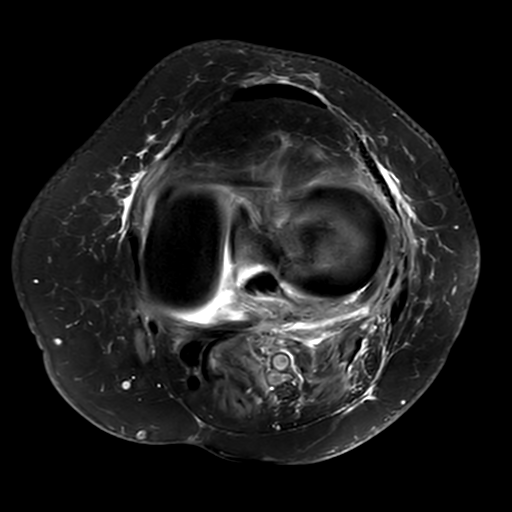
[im 33/38]
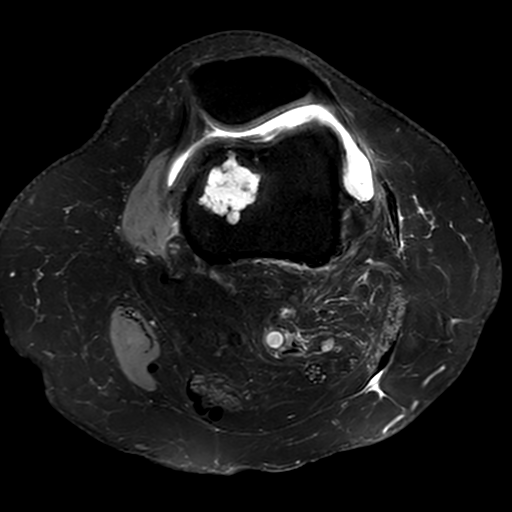

[Series 401: pdw/spair_sag-new · sagittal · 3.0mm · 0.31mm/px · 3 of 33 slices shown]
[im 5/33]
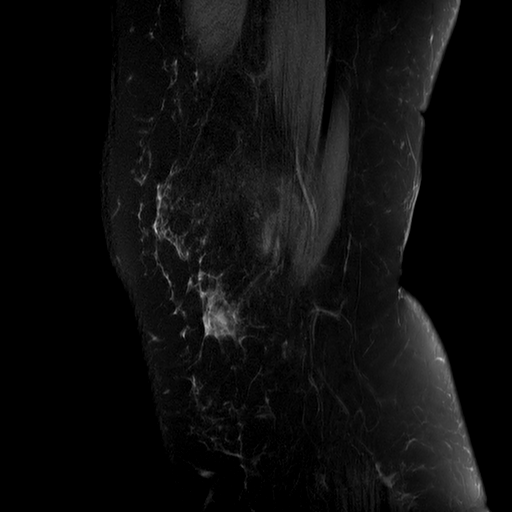
[im 19/33]
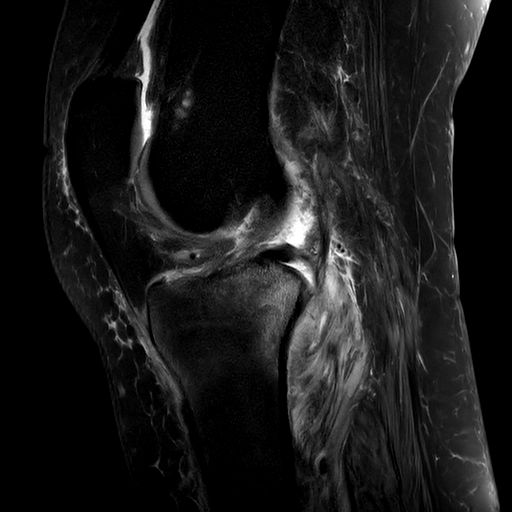
[im 28/33]
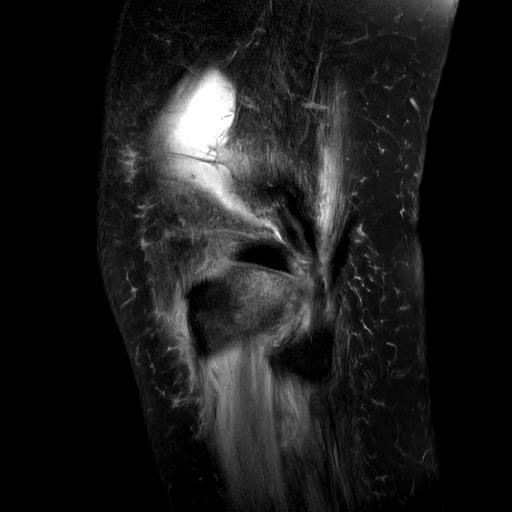

[Series 601: T1 · coronal · 3.0mm · 0.29mm/px · 8 of 39 slices shown]
[im 1/39]
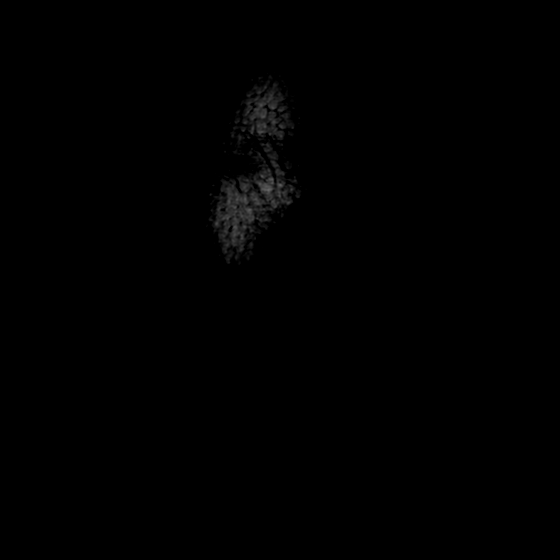
[im 5/39]
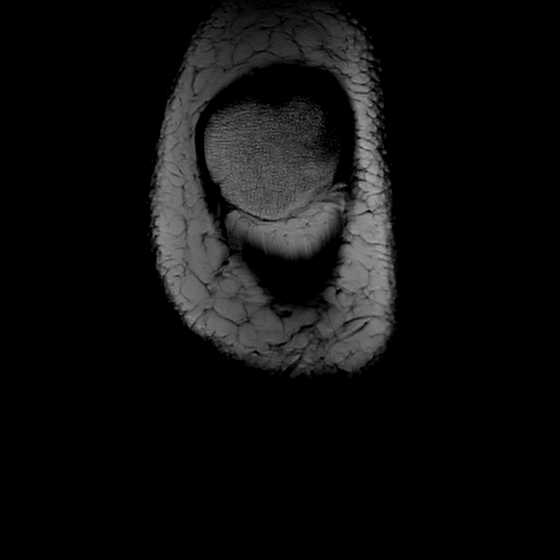
[im 13/39]
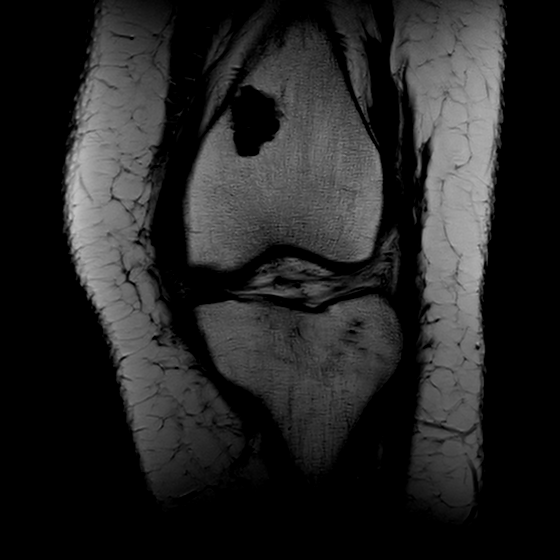
[im 17/39]
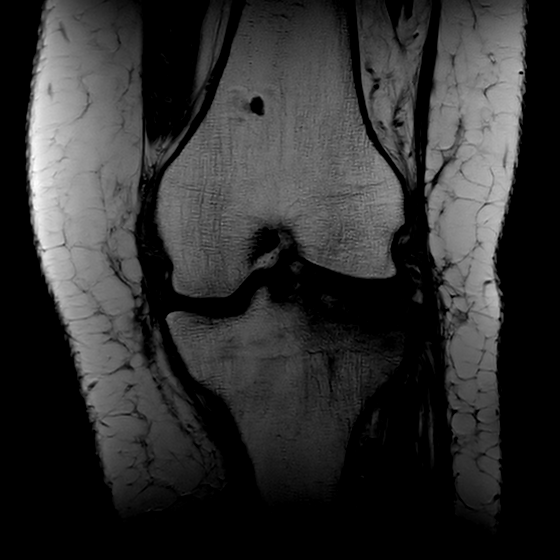
[im 22/39]
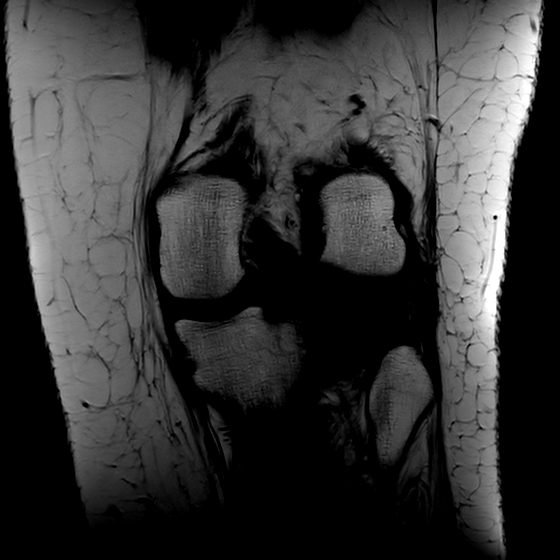
[im 26/39]
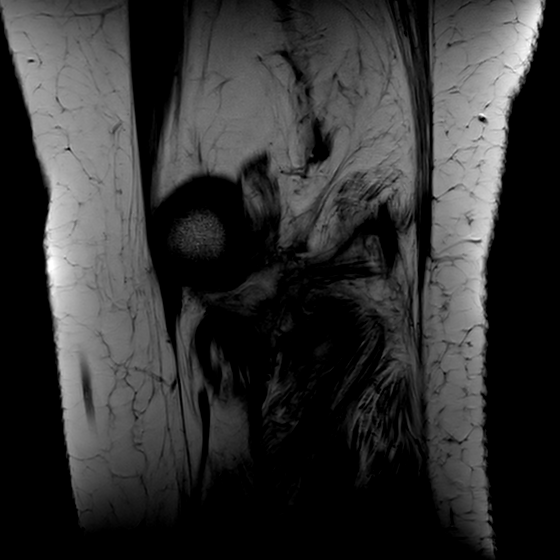
[im 34/39]
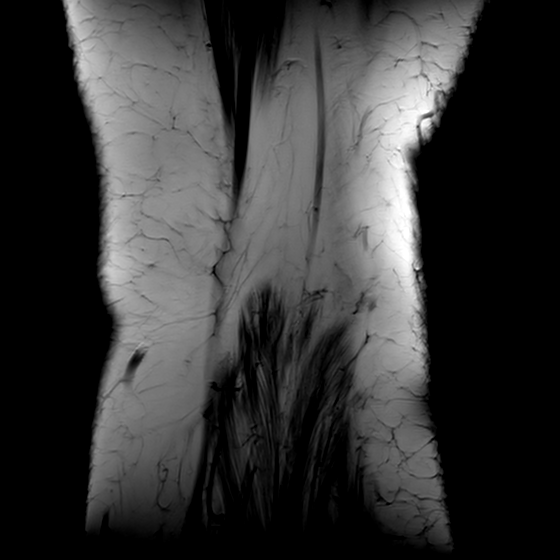
[im 39/39]
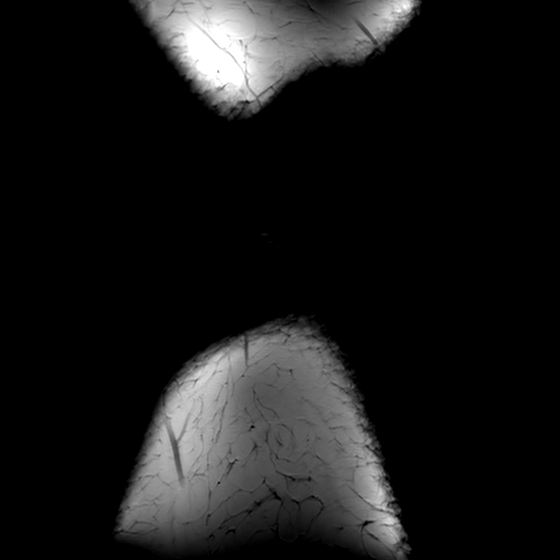

[19 of 40 positions shown; findings below may reference images not displayed]

FINDINGS: MEDIAL COMPARTMENT: The medial meniscus is intact without tear or extrusion. 
Articular cartilage is preserved. 
LATERAL COMPARTMENT: The lateral meniscus is intact without tear or extrusion. 
Articular cartilage is preserved. 
PATELLOFEMORAL COMPARTMENT: The patella is centrally located. Articular 
cartilaginous surfaces of the patella and trochlea are preserved. 
TIBIOFIBULAR COMPARTMENT: Negative. 
LIGAMENTS: The anterior cruciate ligament is intact. The posterior cruciate 
ligament is intact. The medial collateral ligament and lateral collateral 
ligaments are preserved. 
EXTENSOR MECHANISM: The quadriceps and patellar tendon are preserved. The medial 
and lateral retinacula are intact. 
POSTEROMEDIAL CORNER: The semimembranosus and pes anserine tendons are 
preserved. The posterior oblique ligament and posterior medial joint capsule are 
intact. 
POSTEROLATERAL CORNER: The popliteal tendon and popliteofibular ligament are 
intact. The biceps femoris is negative. 
BONES: Subacute to chronic lateral tibial plateau curvilinear and transverse 
insufficiency fracture measures 3.1 x 2.0 cm in cross section with subjacent 
marrow edema throughout the lateral/central proximal tibia. 2.1 x 1.7 x 2.2 cm 
distal femoral chondroid focus without complicating features, consistent with 
enchondroma. .  
ADDITIONAL FINDINGS: Small joint effusion and mild synovitis. No popliteal cyst. 
Multifocal muscular edema, most marked involving the popliteus. Moderate to 
marked calf and moderate semitendinosus/biceps fatty muscular atrophy. 
Neurovascular bundles are negative. Soft tissue swelling.
IMPRESSION: 1.  Subacute to chronic lateral tibial plateau curvilinear/transverse 
insufficiency fracture. DXA may be helpful for further evaluation of bone 
density and consideration for treatment. 
2.  2.1 x 1.7 x 2.2 cm distal femoral enchondroma.   
3.  Small joint effusion and mild synovitis.  
4.  Multifocal muscular edema and fatty muscular atrophy.  
5.  Soft tissue swelling.

## 2021-11-23 IMAGING — MG MAMMOGRAPHY SCREENING BILATERAL 3[PERSON_NAME]
8 series · 8 of 24 positions shown · non-contrast
Comparison: Comparison was made to prior examinations.

________________________________________________________________________________________________ 
MAMMOGRAPHY SCREENING BILATERAL 3JHONJANER RODRIGUE, 11/23/2021 [DATE]: 
CLINICAL INDICATION: Encounter for screening mammogram.
TECHNIQUE: Digital bilateral mammograms and 3-D Tomosynthesis were obtained. 
These were interpreted both primarily and with the aid of computer-aided 
detection system.  
BREAST DENSITY: (Level B) There are scattered areas of fibroglandular density.

[L CC]
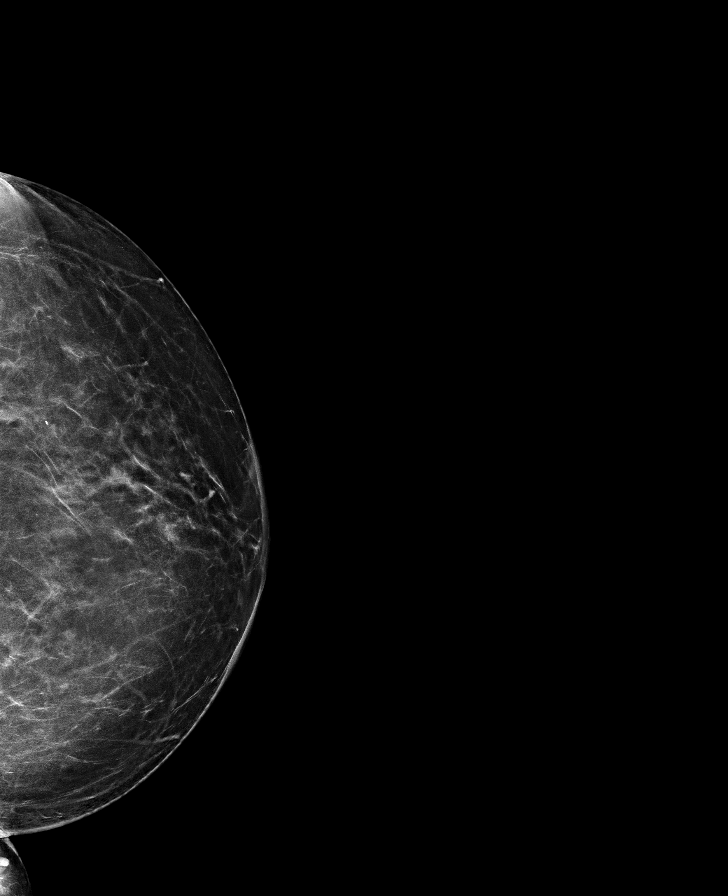

[R MLO]
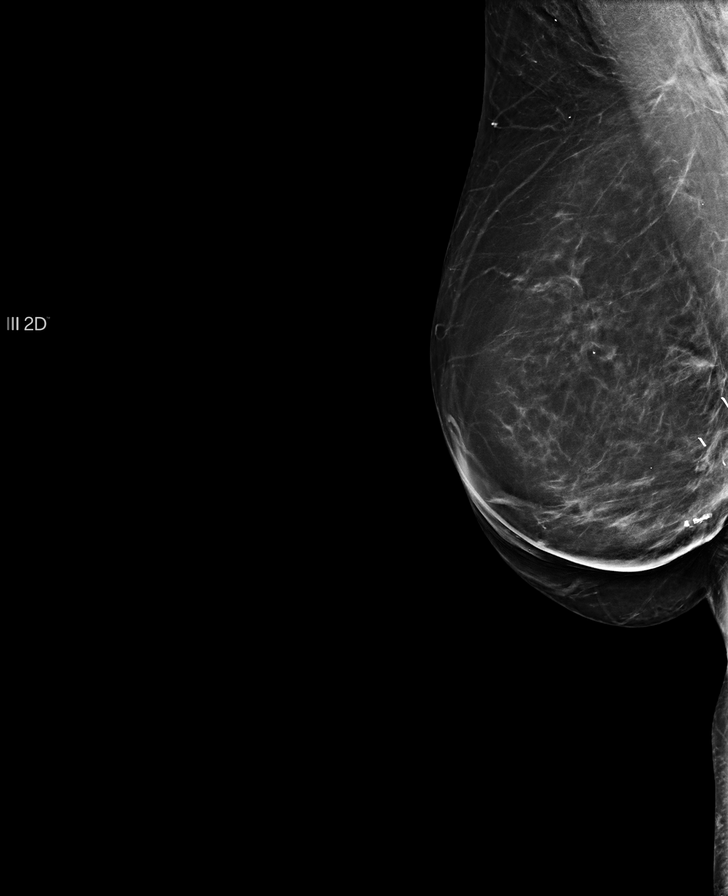

[L MLO]
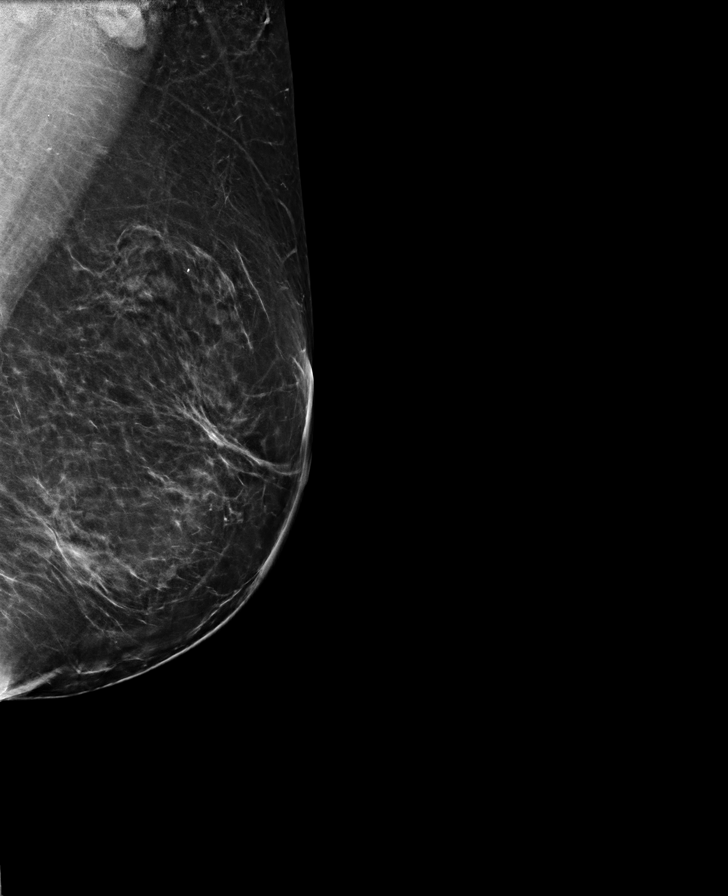

[R CC]
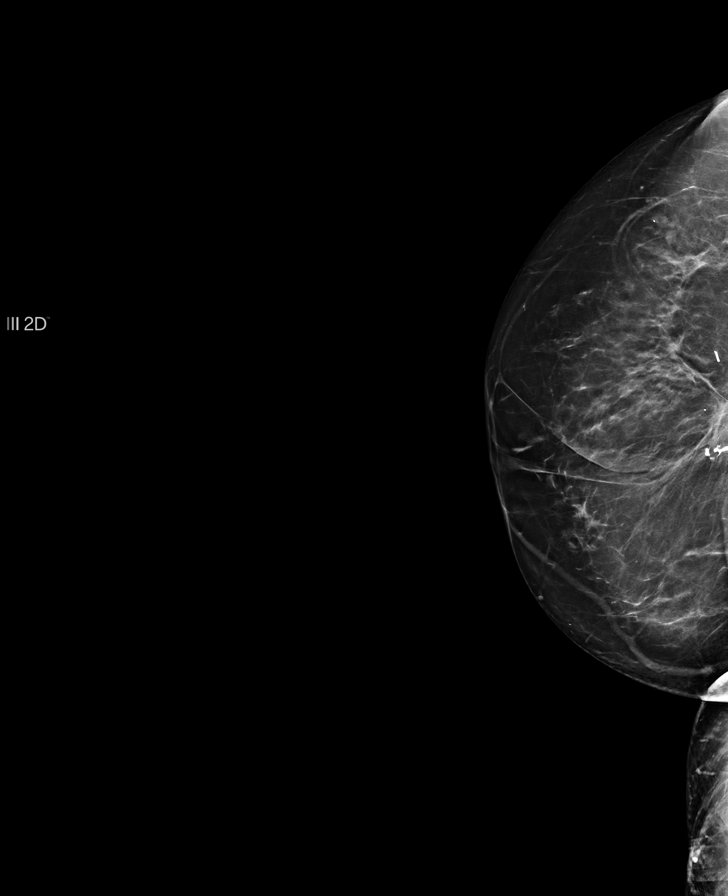

[R MLO tomo · tomo slice 43/86.0]
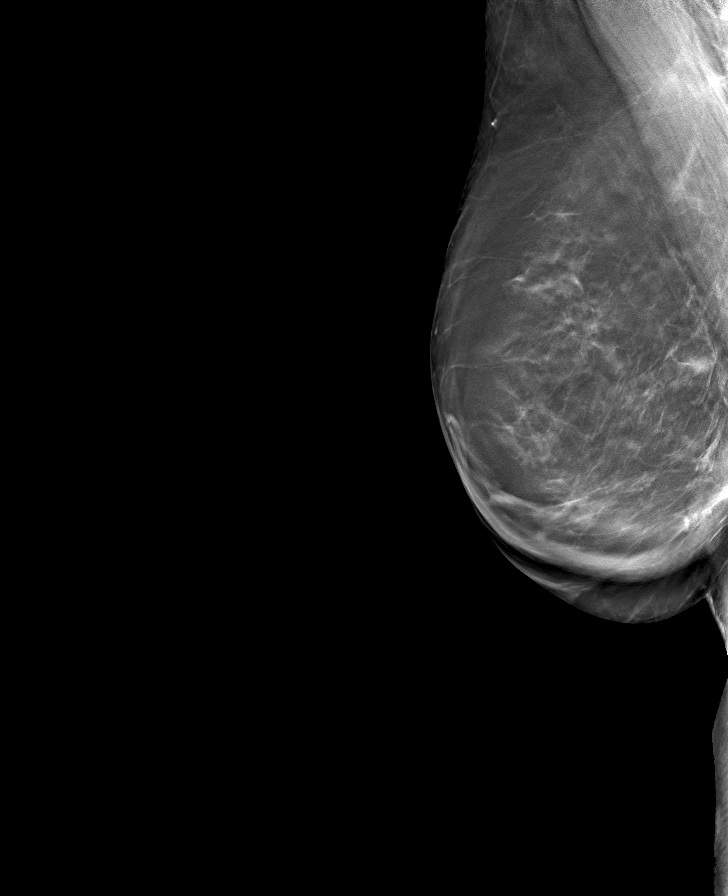

[L MLO tomo · tomo slice 43/86.0]
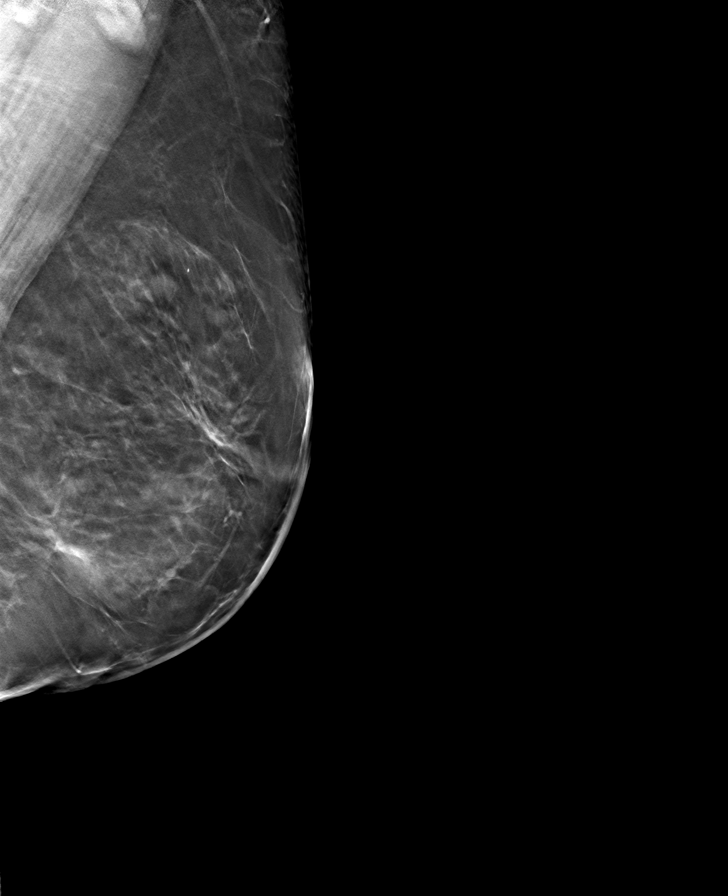

[L CC tomo · tomo slice 41/82.0]
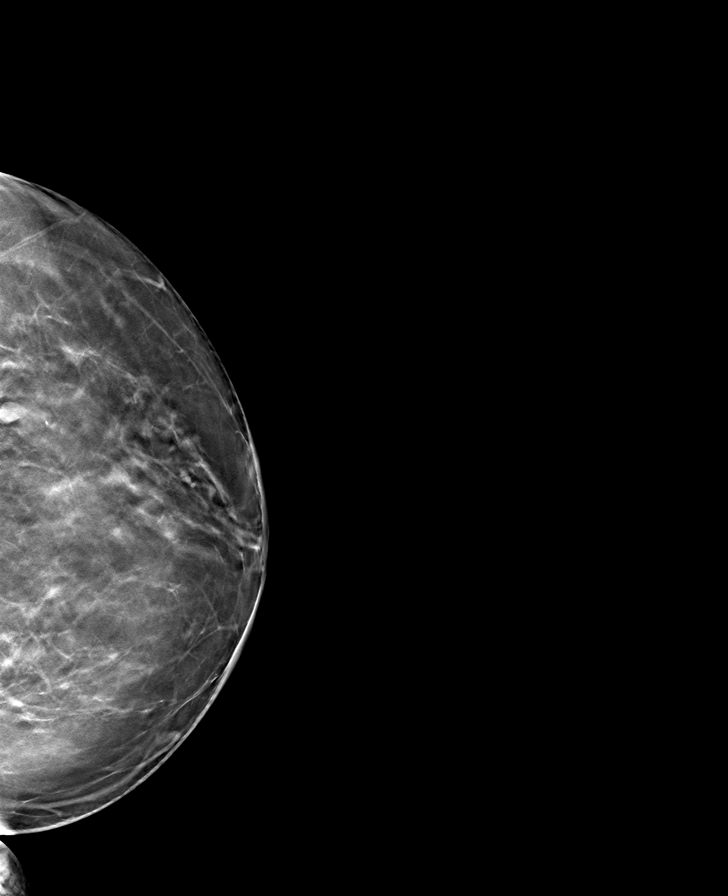

[R CC tomo · tomo slice 39/78.0]
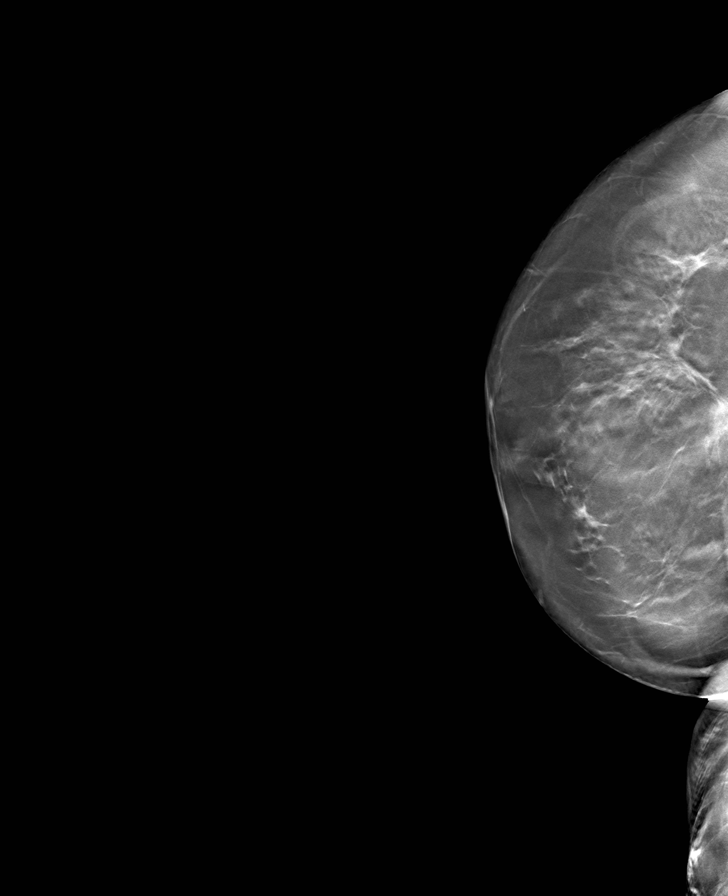

[8 of 24 positions shown; findings below may reference images not displayed]

FINDINGS: Post therapy change right breast. Benign calcification. No suspicious 
mass, calcifications, or area of architectural distortion in either breast.
IMPRESSION: Stable mammogram. 
(BI-RADS 2) Benign findings. Routine mammographic follow-up is recommended.

## 2021-12-24 IMAGING — CT CT ABDOMEN PELVIS W/ UROGRAM
2 of 5 series · 14 of 46 positions shown, 16 images · IV contrast (APPLIED)
Comparison: None

________________________________________________________________________________________________ 
CT ABDOMEN PELVIS W/ UROGRAM, 12/24/2021 [DATE]: 
CLINICAL INDICATION: Calculus of ureter. History of right pelvic 
lymphadenectomy, pelvic radiation and left ureteral reattachment. 
A search for DICOM formatted images was conducted for prior CT imaging studies 
completed at a non-affiliated media free facility.
TECHNIQUE: The region of interest was scanned without and with 100 mL of Isovue 
300 contrast injected intravenously on a high resolution CT scanner using dose 
reduction techniques.  Routine MPR reconstructions were performed.

[Series 5: axial portal · axial · portal-venous · 0.70mm/px · z∈[-441,-84]mm · 11 of 143 slices shown, 13 images]
[im 12/143  soft-tissue]
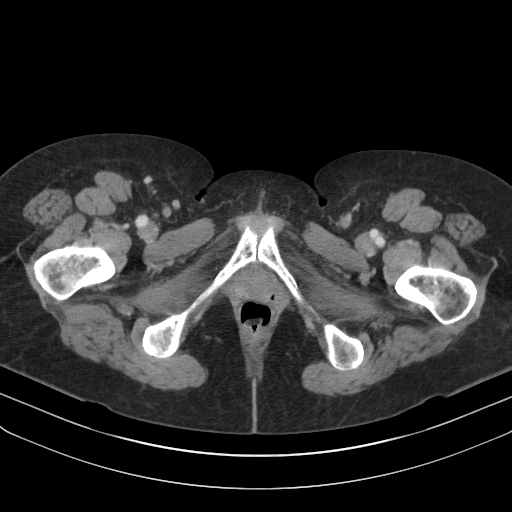
[im 12/143  bone]
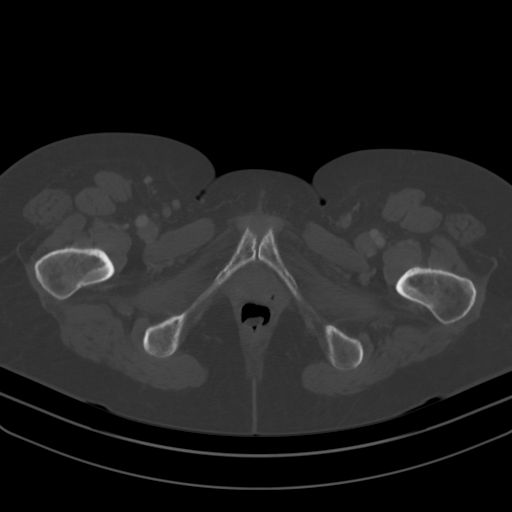
[im 24/143  soft-tissue]
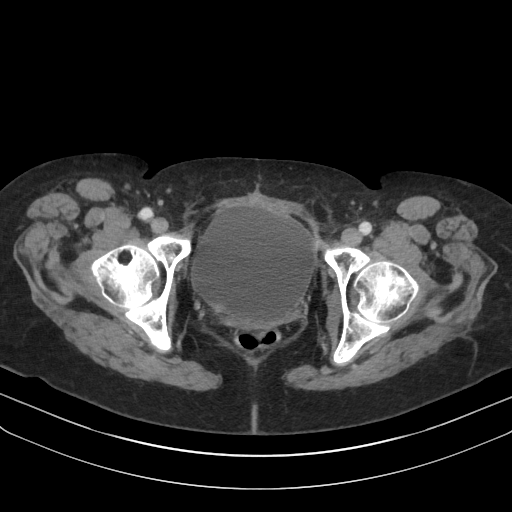
[im 36/143  soft-tissue]
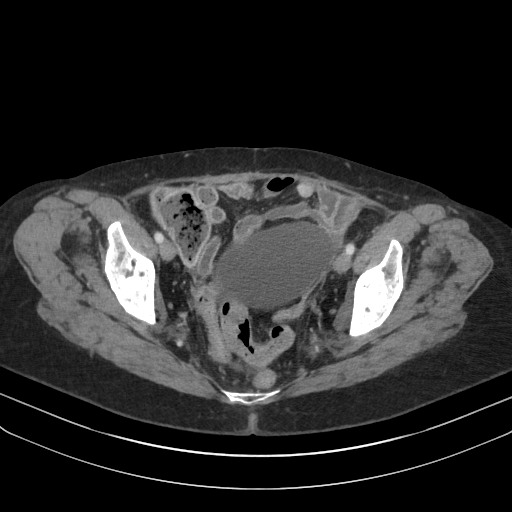
[im 48/143  soft-tissue]
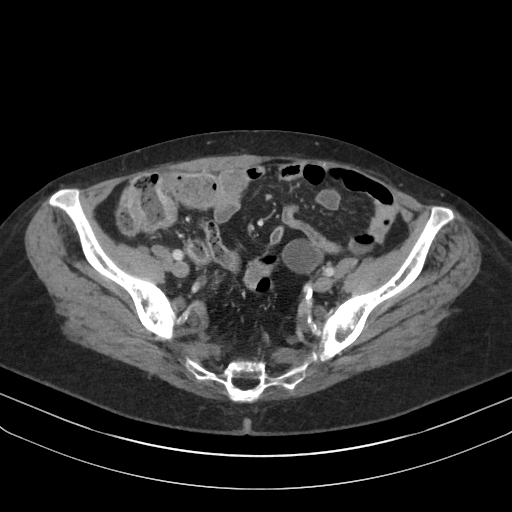
[im 60/143  soft-tissue]
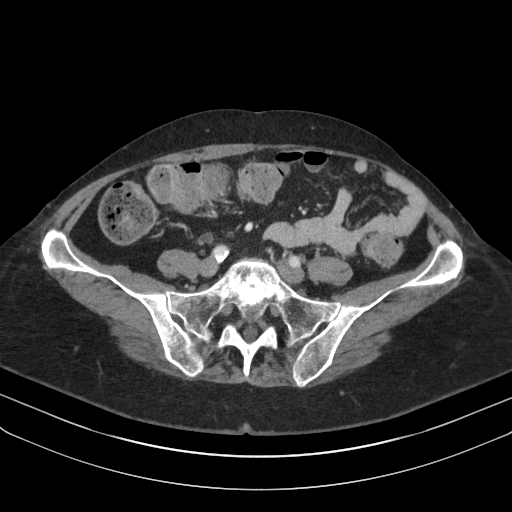
[im 72/143  soft-tissue]
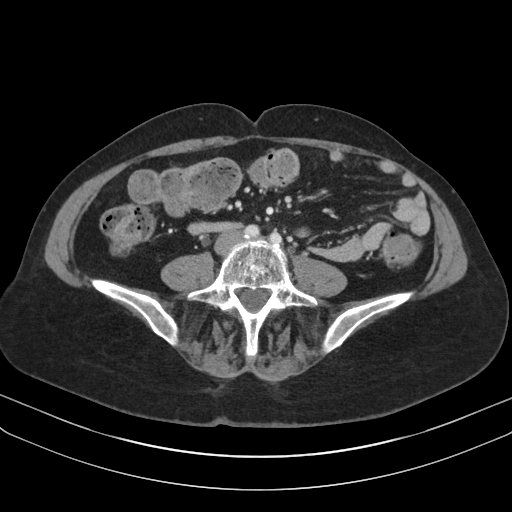
[im 83/143  soft-tissue]
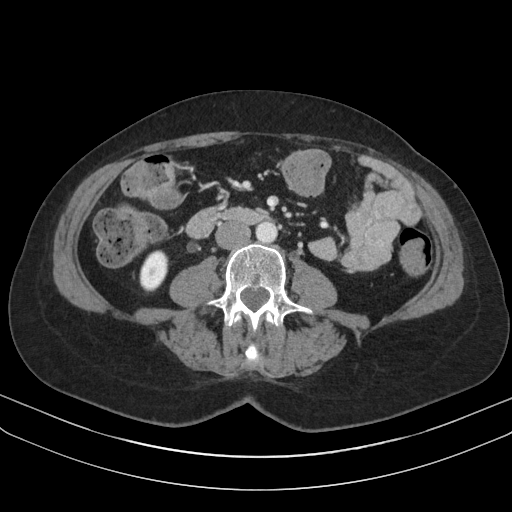
[im 95/143  soft-tissue]
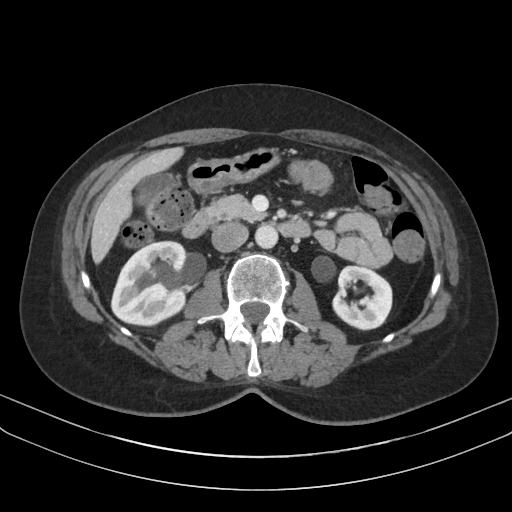
[im 107/143  soft-tissue]
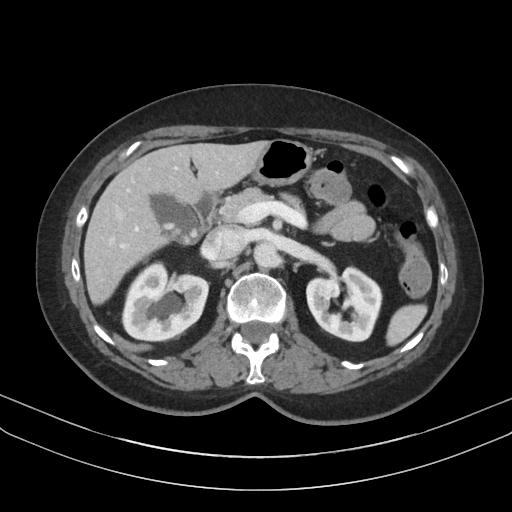
[im 107/143  bone]
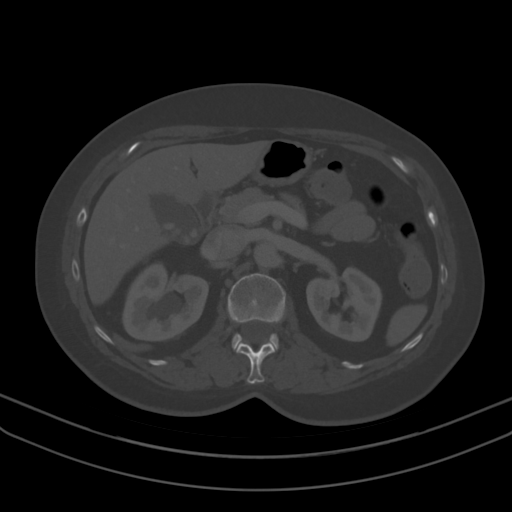
[im 119/143  soft-tissue]
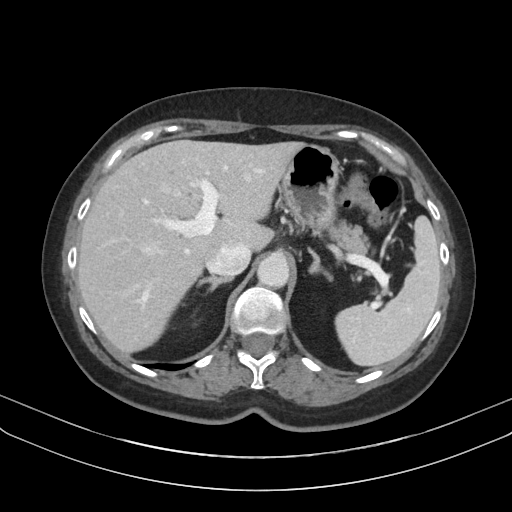
[im 131/143  soft-tissue]
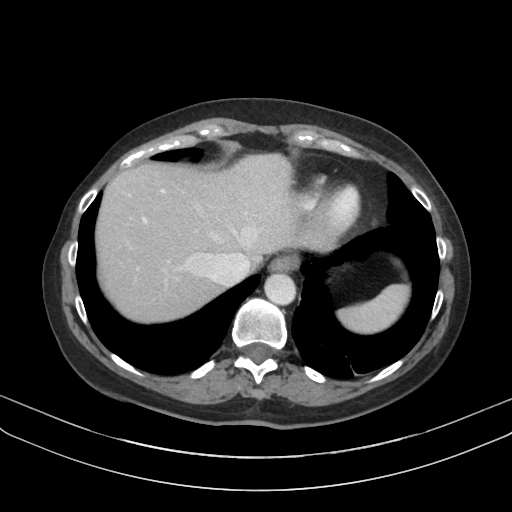

[Series 9: cor delay · coronal · delayed · 0.69mm/px · 3 of 113 slices shown]
[im 38/113  soft-tissue]
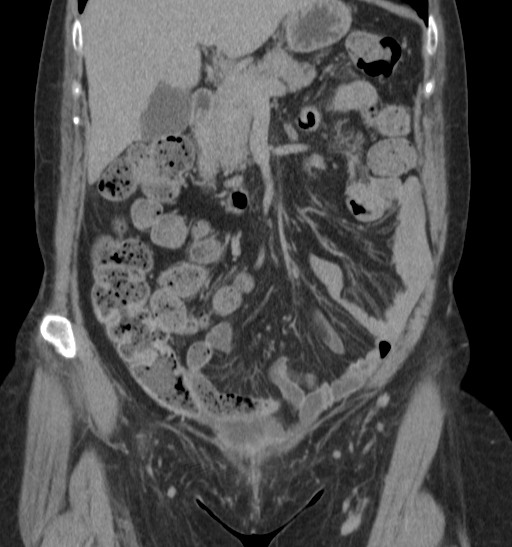
[im 50/113  soft-tissue]
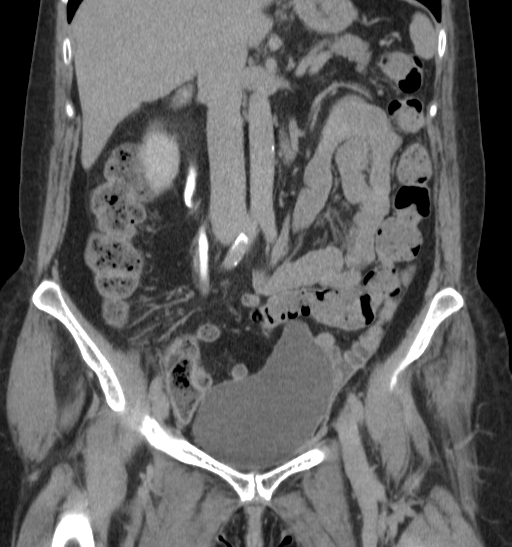
[im 63/113  soft-tissue]
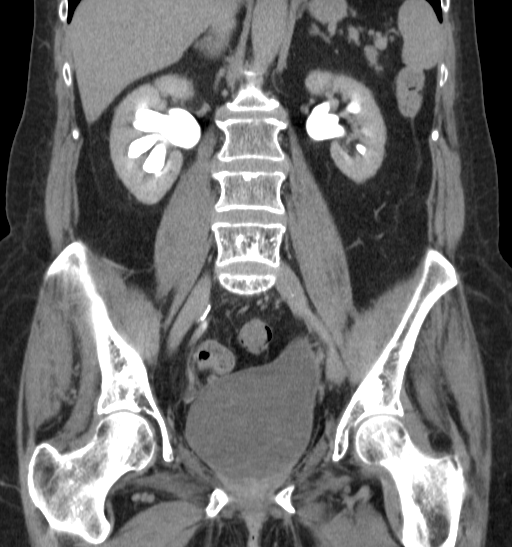

[14 of 46 positions shown; findings below may reference images not displayed]

FINDINGS: KIDNEYS: Right-sided obstructive uropathy with moderate hydronephrosis. 
Extrarenal pelves. No masses.  No nephrolithiasis. 
URETERS: Mild right hydroureter to the level of the partially obstructing 1 mm 
calculus at the UVJ. History of left ureter reattachment. Normal caliber left 
ureter without filling defects.   
BLADDER: Asymmetric bladder contour with tethering of the left superior bladder 
to the sidewall, consistent with scarring. No mass.  Normal wall thickness.  No 
stone. 
LUNG BASES: Mild LLL atelectasis. 
HEPATOBILIARY: No mass or biliary dilatation. Cholelithiasis. 
SPLEEN: Within normal limits. 
PANCREAS: No mass.  No pancreatic fluid collections. 
ADRENALS: No masses. 
LYMPH NODES: No adenopathy. 
STOMACH, SMALL BOWEL AND COLON: Fecaloid material in the distal ileum, 
consistent with slow transit. Stool throughout the colon. No bowel wall 
thickening or obstruction. 
VASCULAR STRUCTURES: No aneurysm. Atherosclerosis. 
MUSCULOSKELETAL: Multifocal sclerosis in the iliac bones, L5 vertebral body and 
midsacrum, consistent with postradiation change and osteonecrosis. Osteopenia. 
No acute osseous abnormality. Scattered degenerative changes. 1.1 cm right 
anterior femoral 
neck synovial herniation pit. 
OTHER: Metallic seeds/markers in the cervix.
IMPRESSION: 1.  Right-sided obstructive uropathy with hydronephroureter to the level of the 
1 mm calculus at the UVJ. 
2.  History of left ureter reattachment. Normal caliber left ureter without 
filling defects.   
3.  Asymmetric bladder contour with tethering of the left superior bladder to 
the sidewall, consistent with scarring.  
4.  Cholelithiasis. 
5.  Metallic seeds/markers in the cervix. 
6.  Fecaloid material in the distal ileum, consistent with slow transit. Stool 
throughout the colon.  
7.  Chronic osseous changes. 
RADIATION DOSE REDUCTION: All CT scans are performed using radiation dose 
reduction techniques, when applicable.  Technical factors are evaluated and 
adjusted to ensure appropriate moderation of exposure.  Automated dose 
management technology is applied to adjust the radiation doses to minimize 
exposure while achieving diagnostic quality images.

## 2022-01-18 IMAGING — MR MRI ABDOMEN W/WO CONTRAST WITH MRCP
17 of 23 series · 31 of 48 positions shown · IV contrast (gadolinium)
Comparison: MR abdomen February 25, 2001

________________________________________________________________________________________________ 
MRI ABDOMEN W/WO CONTRAST WITH MRCP, 01/18/2022 [DATE]: 
CLINICAL INDICATION: Follow-up pancreatic cystic lesion. Remote history of 
breast and cervical malignancy.
TECHNIQUE: Multiplanar, multi acquisition MR images of the abdomen were 
performed without and with intravenous gadolinium enhancement including dynamic 
imaging.  MRCP sequences were performed with post processing.  7.5 mL of 
Gadavist were injected intravenously. 0 mL of Gadavist was discarded.  
Patient was scanned on a 3T magnet.

[Series 201: survey navi · axial · 15.0mm · 1.56mm/px · 1 of 11 slices shown]
[im 1/11]
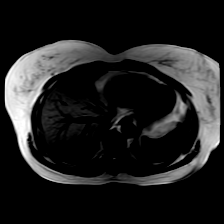

[Series 301: T2 · coronal · 5.0mm · 0.52mm/px · 1 of 30 slices shown (1 of 2)]
[im 1/30]
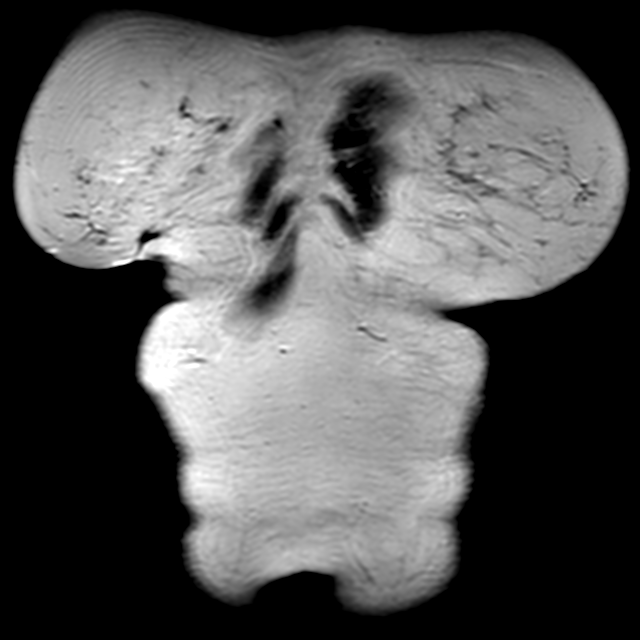

[Series 402: sout of phase · axial · 5.5mm · 1.15mm/px · 1 of 32 slices shown]
[im 1/32]
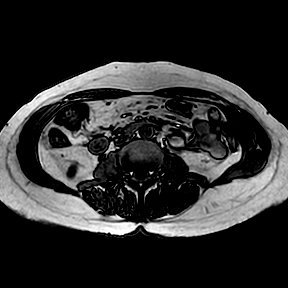

[Series 403: sin phase · axial · 5.5mm · 1.15mm/px · 1 of 32 slices shown]
[im 1/32]
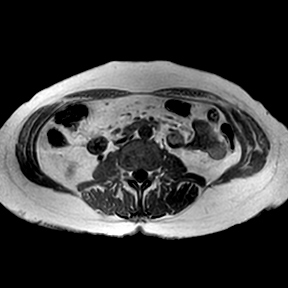

[Series 501: T2 · axial · 5.0mm · 0.76mm/px · 1 of 34 slices shown (2 of 2)]
[im 1/34]
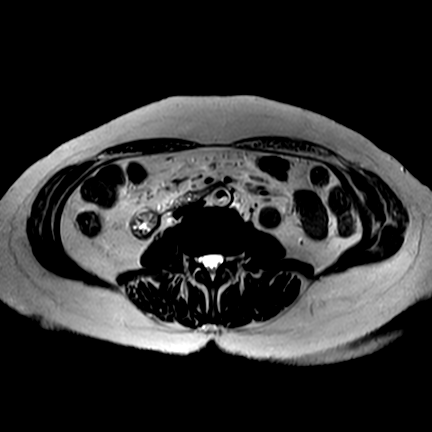

[Series 601: T2 fat-sat · axial · 3.2mm · 0.74mm/px · 1 of 38 slices shown]
[im 1/38]
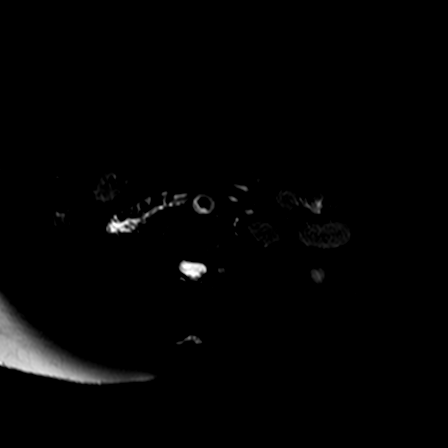

[Series 801: ssh_mrcprad · coronal · 40.0mm · 0.59mm/px · 1 of 6 slices shown]
[im 1/6]
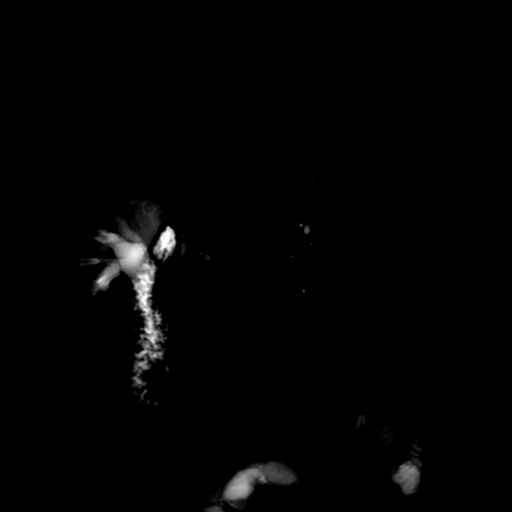

[Series 902: dadc 600 · axial · 5.0mm · 1.37mm/px · 1 of 45 slices shown]
[im 1/45]
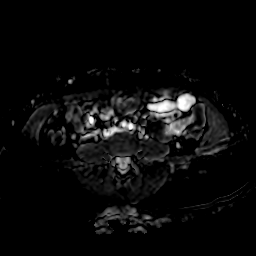

[Series 903: sb0 · axial · 5.0mm · 1.37mm/px · 1 of 45 slices shown]
[im 1/45]
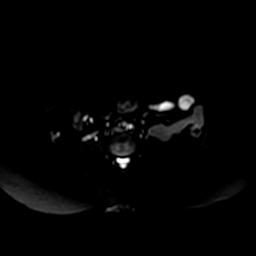

[Series 904: (id) · axial · 5.0mm · 1.37mm/px · 1 of 45 slices shown]
[im 1/45]
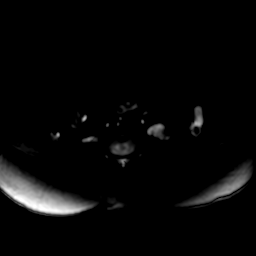

[Series 1002: DIXON · axial · 3.5mm · 0.82mm/px · z∈[-30,+188]mm · 3 of 126 slices shown (1 of 5)]
[im 1/126]
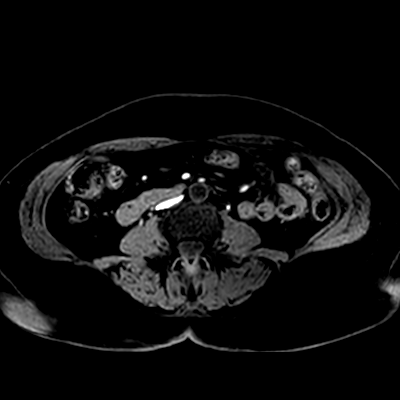
[im 63/126]
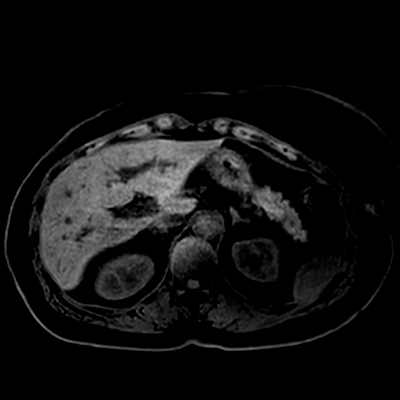
[im 126/126]
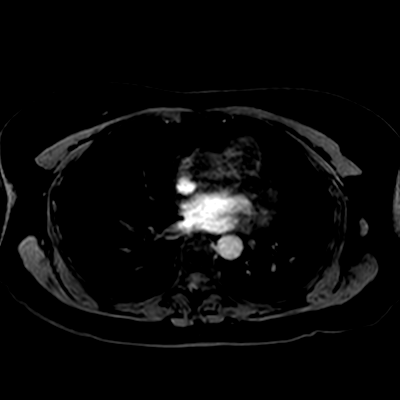

[Series 1003: DIXON · axial · 3.5mm · 0.82mm/px · z∈[-30,+188]mm · 3 of 126 slices shown (2 of 5)]
[im 1/126]
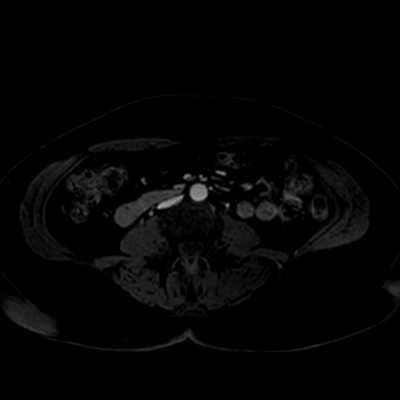
[im 63/126]
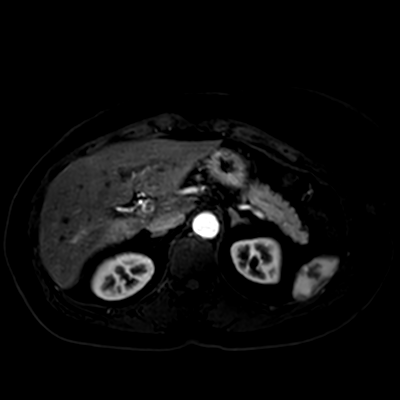
[im 126/126]
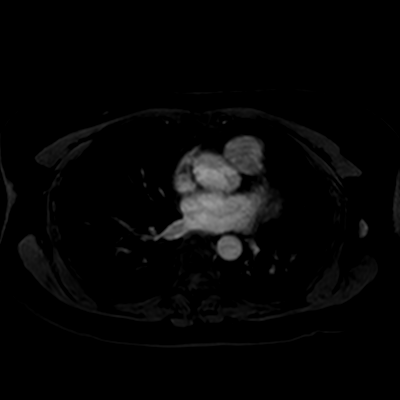

[Series 1004: DIXON · axial · 3.5mm · 0.82mm/px · z∈[-30,+188]mm · 3 of 126 slices shown (3 of 5)]
[im 1/126]
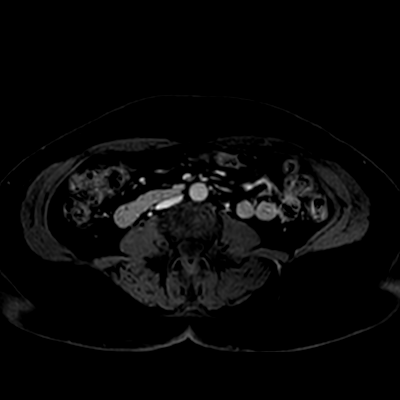
[im 63/126]
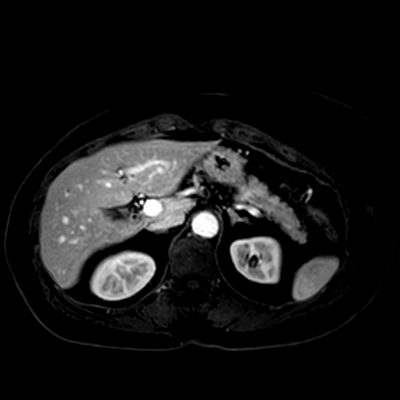
[im 126/126]
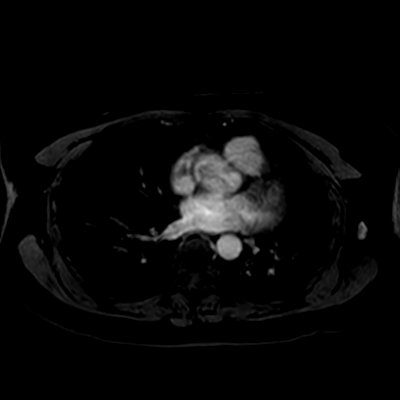

[Series 1005: DIXON · axial · 3.5mm · 0.82mm/px · z∈[-30,+188]mm · 3 of 126 slices shown (4 of 5)]
[im 1/126]
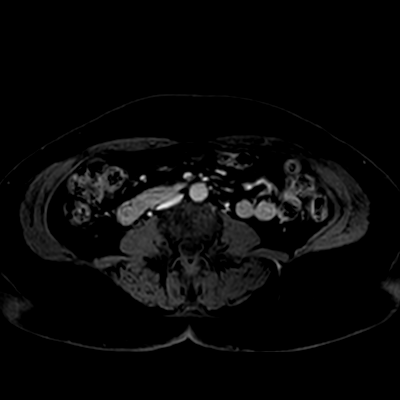
[im 63/126]
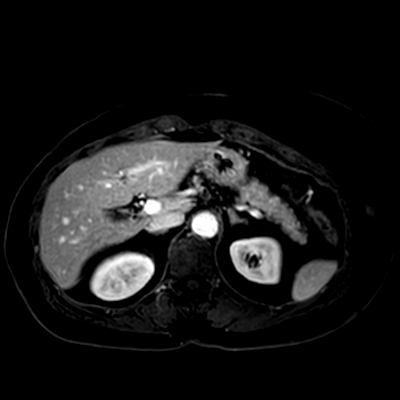
[im 126/126]
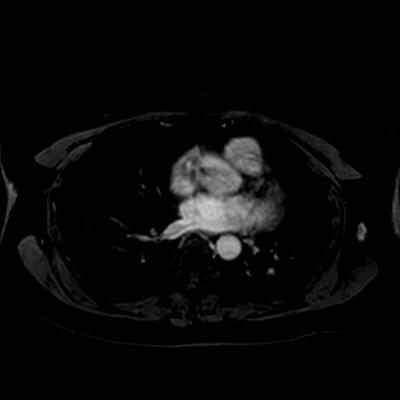

[Series 1006: DIXON · axial · 3.5mm · 0.82mm/px · z∈[-30,+188]mm · 3 of 126 slices shown (5 of 5)]
[im 1/126]
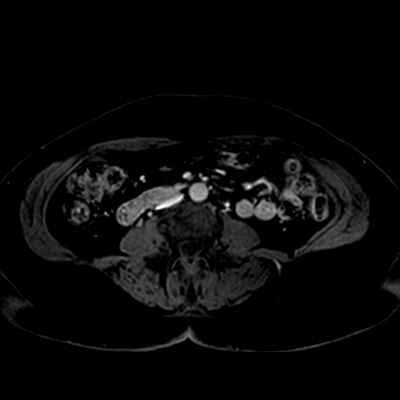
[im 63/126]
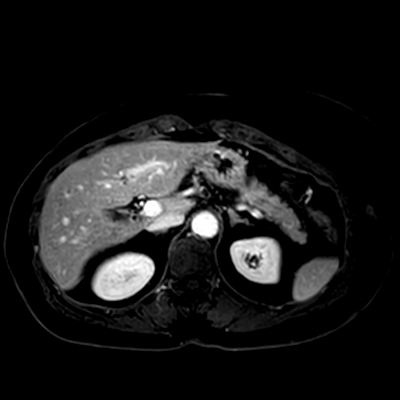
[im 126/126]
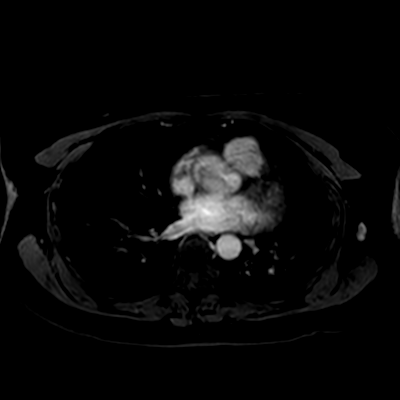

[Series 1007: DIXON post-contrast · axial · 3.5mm · 0.82mm/px · z∈[-30,+188]mm · 4 of 126 slices shown (1 of 2)]
[im 1/126]
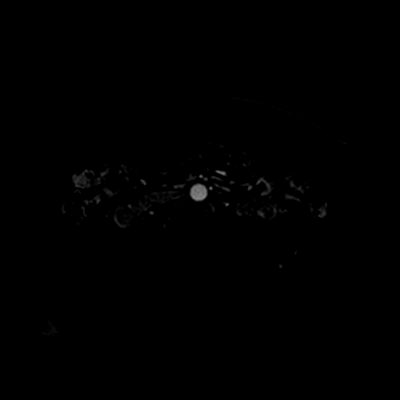
[im 42/126]
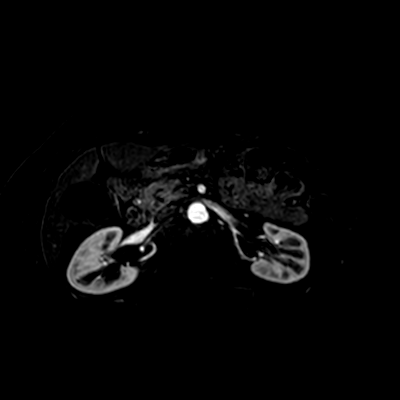
[im 84/126]
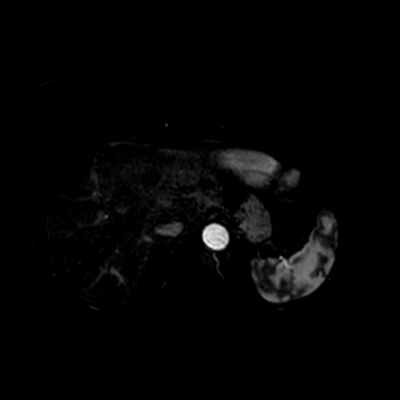
[im 126/126]
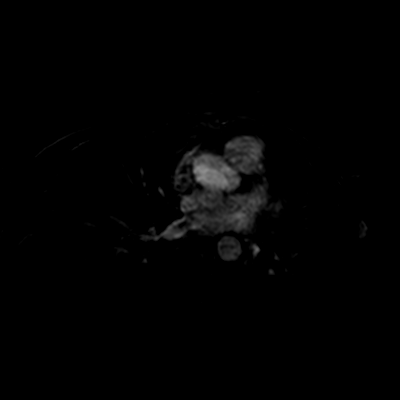

[Series 1008: DIXON post-contrast · axial · 3.5mm · 0.82mm/px · z∈[-30,+41]mm · 2 of 126 slices shown (2 of 2)]
[im 1/126]
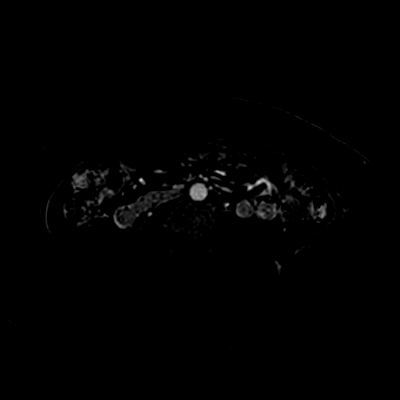
[im 42/126]
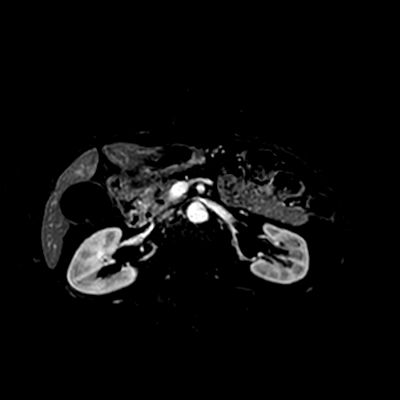

[31 of 48 positions shown; findings below may reference images not displayed]

FINDINGS: Pancreatic cystic lesions are best visualized in their entirety on MRCP images. 
Currently the cystic lesion in the body/tail measures up to 9 mm on MRCP 
sequences and is unchanged from the prior exam. There is an area which appears 
to be a potentially cystic lesion in the head of the gland on MRCP sequences but 
which is felt to represent tortuosity of the duct rather than a true cystic 
lesion. The main pancreatic duct is normal in caliber. No suspicious solid 
pancreatic lesions or focal restricted diffusion is found. The pancreas is 
otherwise unremarkable. The liver is normal except for scattered tiny cysts. 
There are numerous dependent stones in the otherwise unremarkable gallbladder. 
The biliary tract is normal in caliber. The spleen, adrenal glands, and kidneys 
are unremarkable. Abdominal aorta is normal in caliber. No incidental GI tract 
pathology.
IMPRESSION: 1. The 9 mm cystic lesion in the body/tail of the gland is stable. 
2. Uncomplicated cholelithiasis again noted.

## 2022-11-28 IMAGING — MG MAMMOGRAPHY SCREENING BILATERAL 3[PERSON_NAME]
8 series · 8 of 24 positions shown · non-contrast
Comparison: 11/23/2021 and exams dating back to 09/18/2013.

________________________________________________________________________________________________ 
MAMMOGRAPHY SCREENING BILATERAL 3DON LOLITO ONACRAM, 11/28/2022 [DATE]: 
CLINICAL INDICATION: Encounter for screening mammogram. History of right breast 
cancer with lumpectomy and radiation therapy in 8552. History of breast 
reduction in 4790.
TECHNIQUE: Digital bilateral mammograms and 3-D Tomosynthesis were obtained. 
These were interpreted both primarily and with the aid of computer-aided 
detection system.  
BREAST DENSITY: (Level D) The breasts are extremely dense, which lowers the 
sensitivity of mammography.

[L MLO]
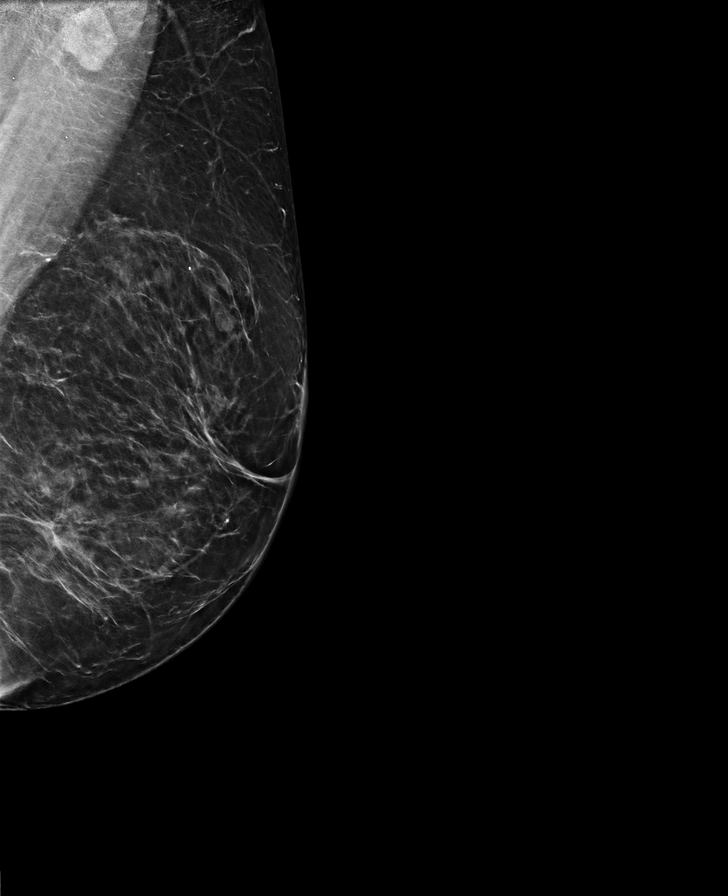

[R CC]
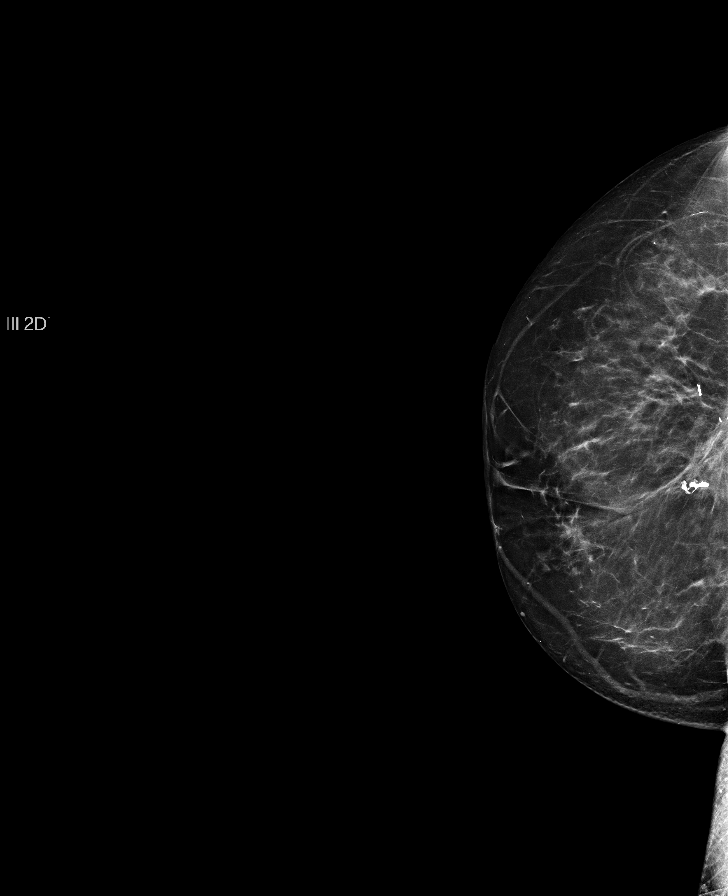

[L CC]
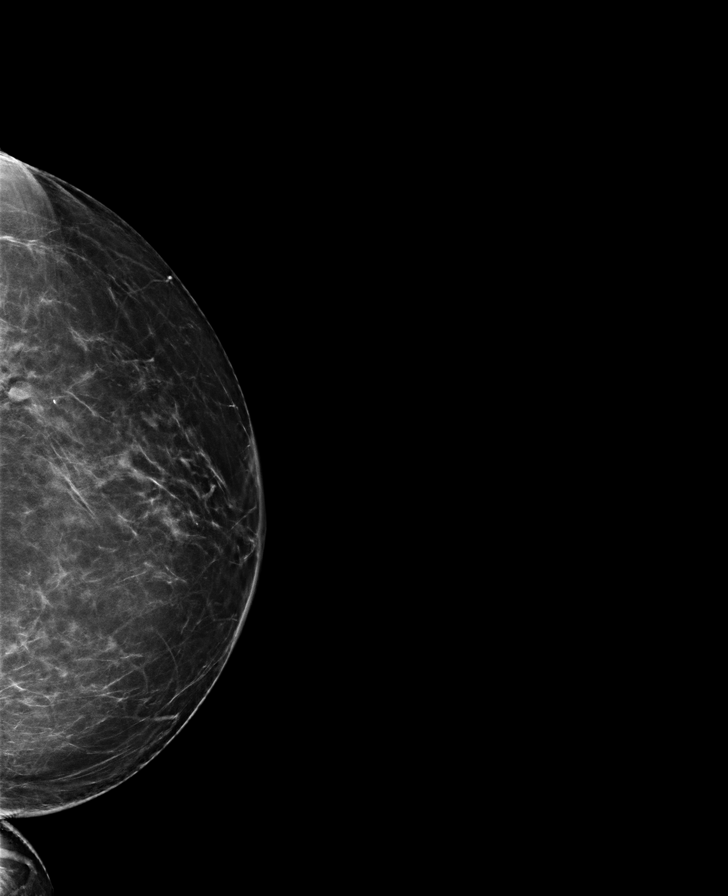

[R MLO]
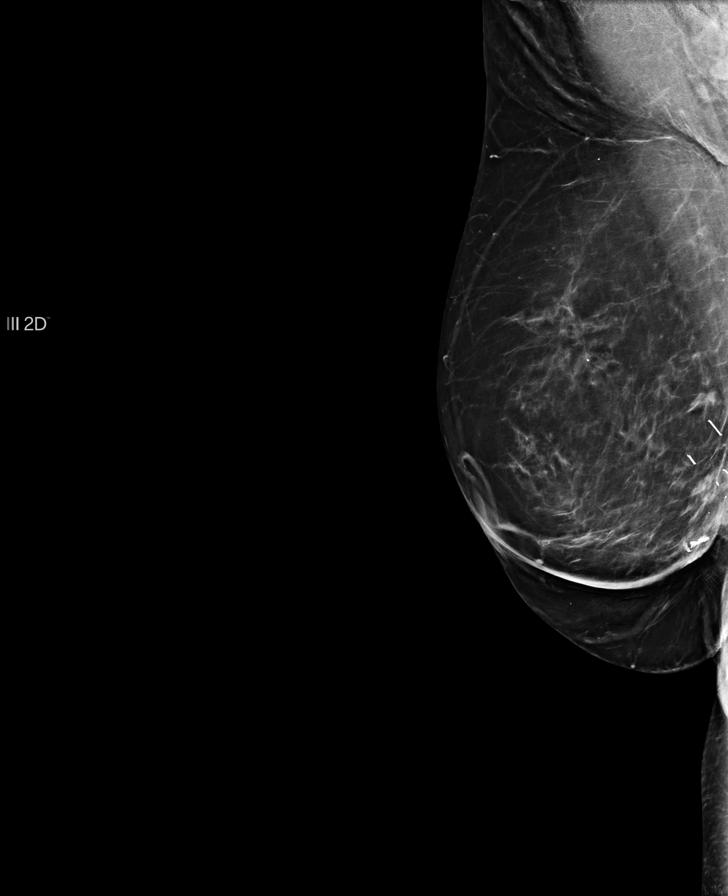

[L CC tomo · tomo slice 41/82.0]
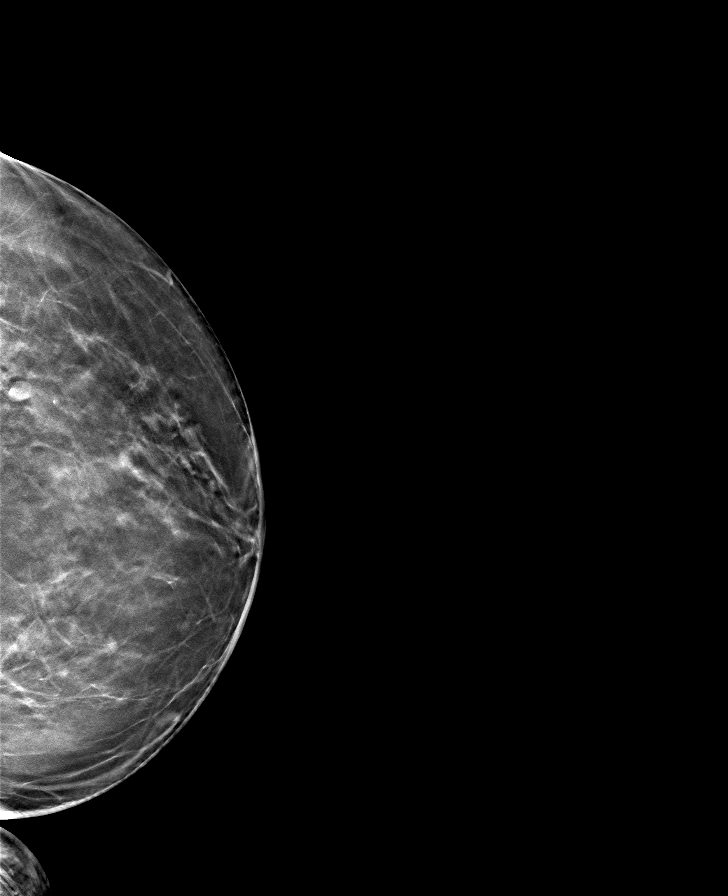

[L MLO tomo · tomo slice 41/81.0]
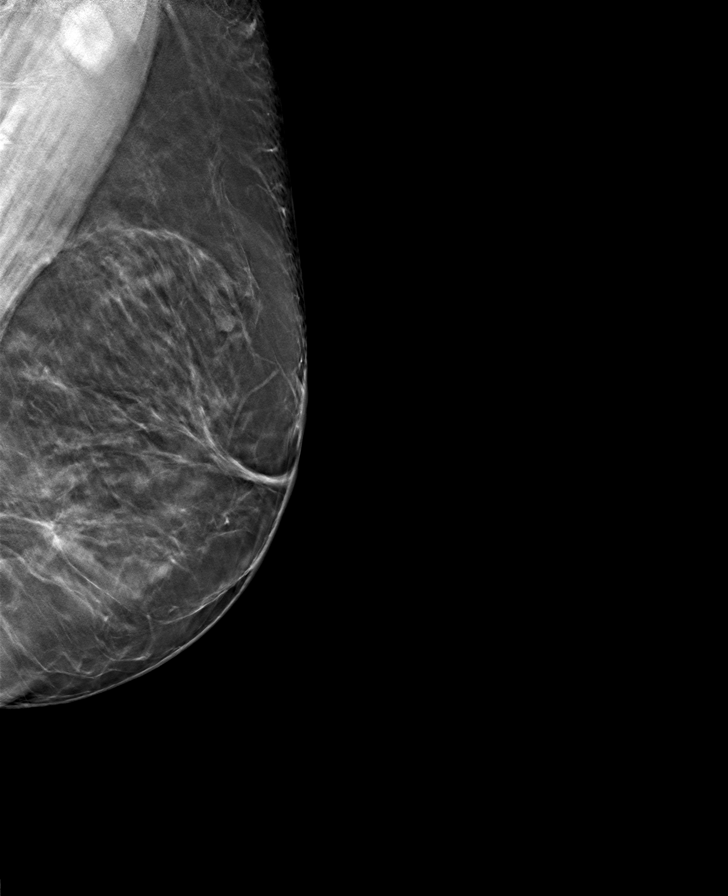

[R MLO tomo · tomo slice 43/85.0]
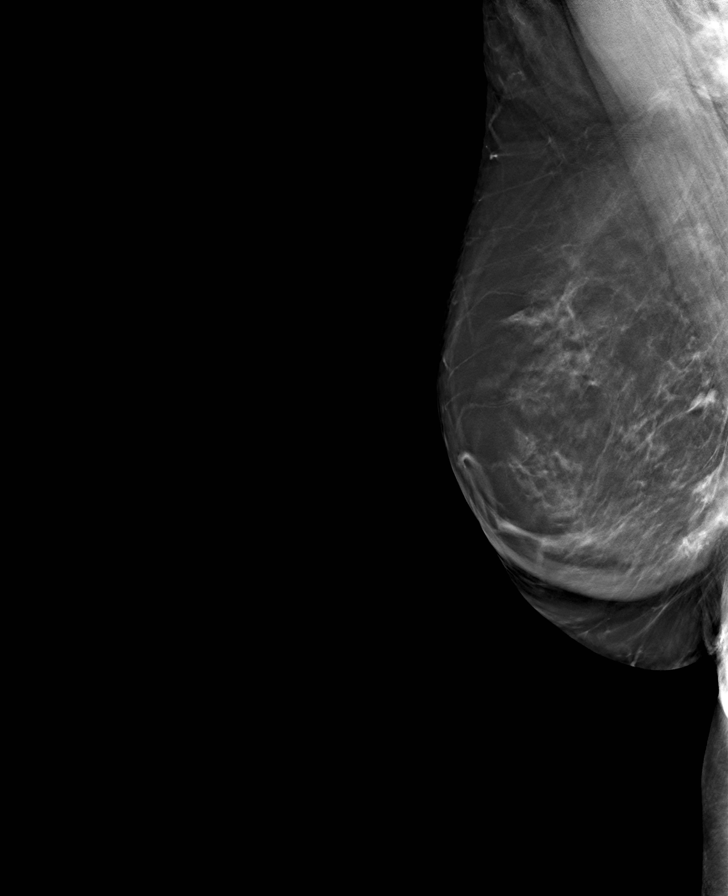

[R CC tomo · tomo slice 37/73.0]
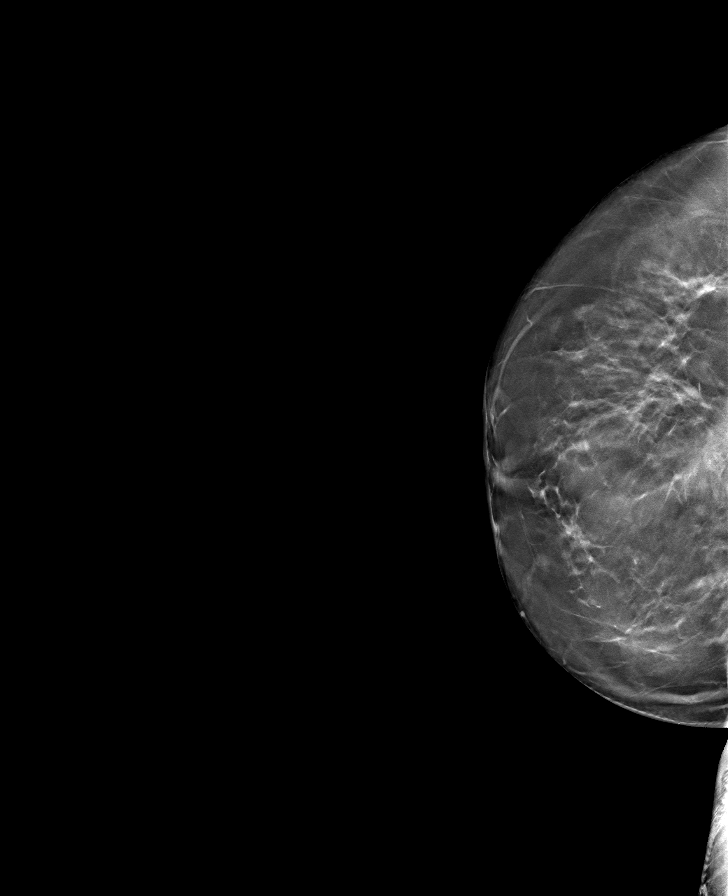

[8 of 24 positions shown; findings below may reference images not displayed]

FINDINGS: Posttreatment changes right breast are again seen. Breast parenchymal 
pattern bilaterally correlates with patients history of breast reduction and is 
unchanged. No suspicious mass, calcifications, or area of architectural 
distortion in either breast.
IMPRESSION: No mammographic findings suggestive for malignancy. 
(BI-RADS 2) Benign findings. Routine mammographic follow-up is recommended.

## 2023-01-24 IMAGING — MR MRI BRAIN W/WO CONTRAST
12 of 16 series · 32 of 48 positions shown · IV contrast (gadavist)
Comparison: MRI brain from March 12, 2021 and June 02, 2021.

________________________________________________________________________________________________ 
MRI BRAIN W/WO CONTRAST, 01/24/2023 [DATE]: 
CLINICAL INDICATION: Headache. Breast cancer.
TECHNIQUE: Multiplanar, multiecho position MR images of the brain were performed 
without and with 7 mL of Gadavist were injected intravenously by hand. 0.5 mL of 
Gadavist discarded. Patient was scanned on a 3T magnet.

[Series 101: survey · axial · 10.0mm · 0.98mm/px · 1 of 5 slices shown]
[im 1/5]
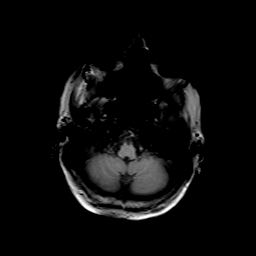

[Series 203: dadc map · axial · 4.0mm · 1.07mm/px · 1 of 30 slices shown (1 of 2)]
[im 1/30]
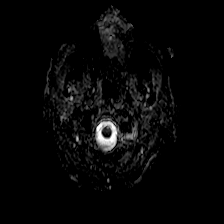

[Series 204: isob (id) · axial · 4.0mm · 1.07mm/px · 1 of 30 slices shown (1 of 2)]
[im 1/30]
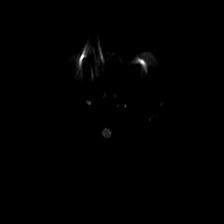

[Series 303: dadc map · coronal · 4.0mm · 0.81mm/px · 1 of 36 slices shown (2 of 2)]
[im 1/36]
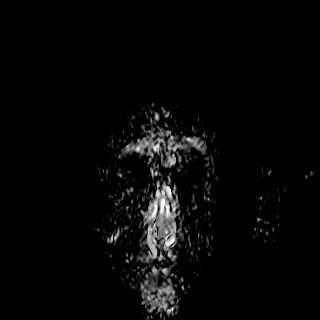

[Series 304: isob (id) · coronal · 4.0mm · 0.81mm/px · 2 of 36 slices shown (2 of 2)]
[im 1/36]
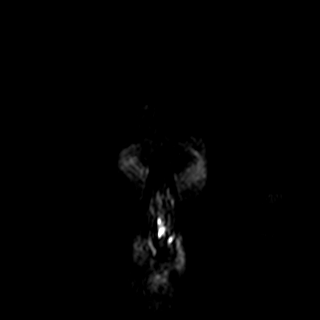
[im 36/36]
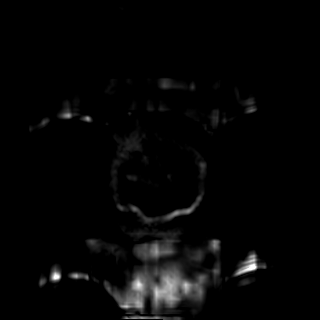

[Series 401: t1_se_sag · sagittal · 4.0mm · 0.42mm/px · 2 of 27 slices shown]
[im 1/27]
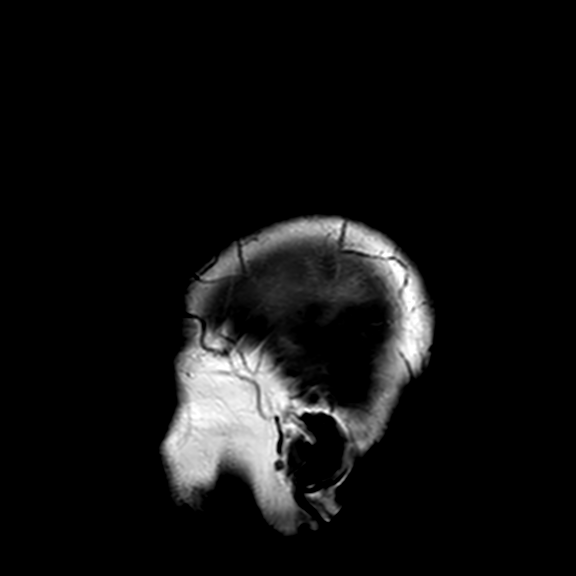
[im 27/27]
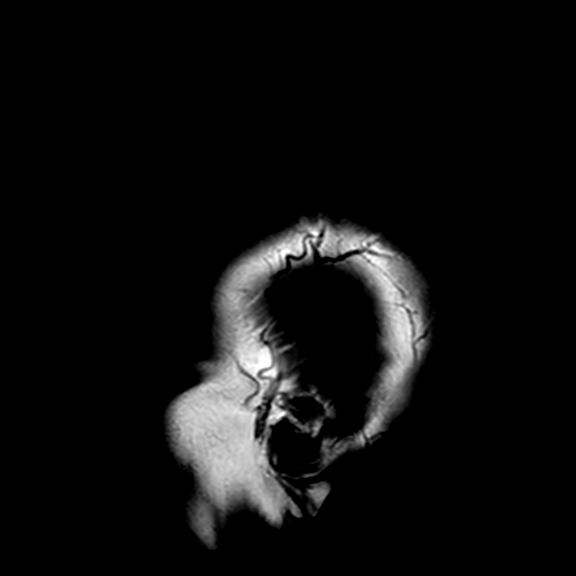

[Series 501: FLAIR fat-sat · axial · 5.0mm · 0.60mm/px · z∈[-49,+112]mm · 2 of 28 slices shown]
[im 1/28]
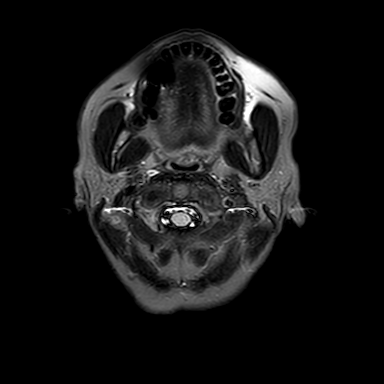
[im 28/28]
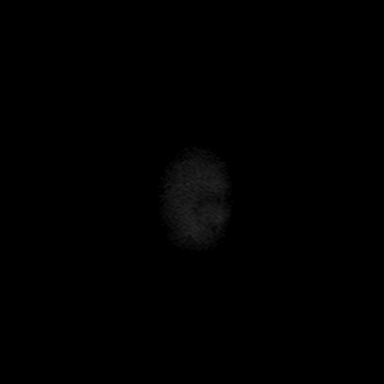

[Series 601: SWI · axial · 3.0mm · 0.53mm/px · z∈[-43,+104]mm · 6 of 100 slices shown (1 of 2)]
[im 1/100]
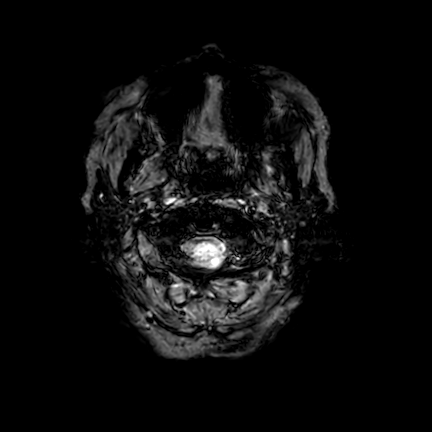
[im 20/100]
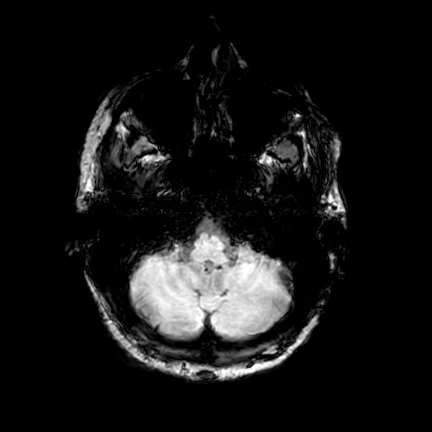
[im 40/100]
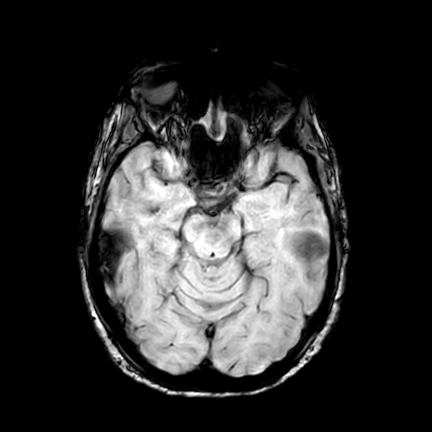
[im 60/100]
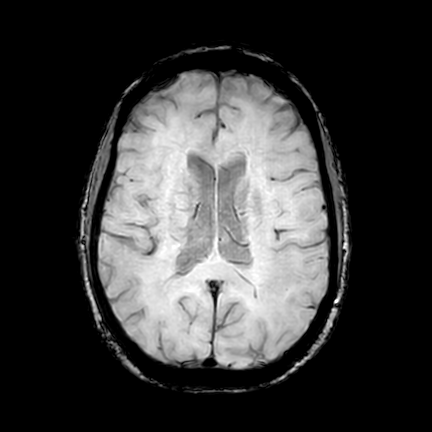
[im 80/100]
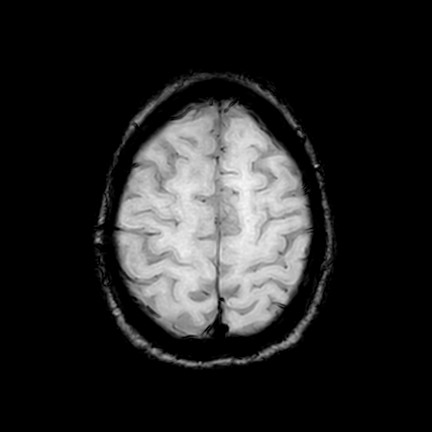
[im 100/100]
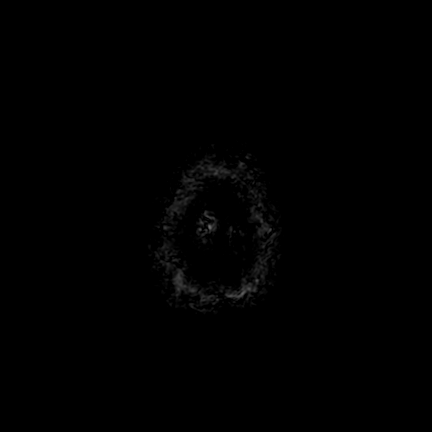

[Series 602: SWI · axial · 10.0mm · 0.53mm/px · z∈[-44,+109]mm · 4 of 78 slices shown (2 of 2)]
[im 1/78]
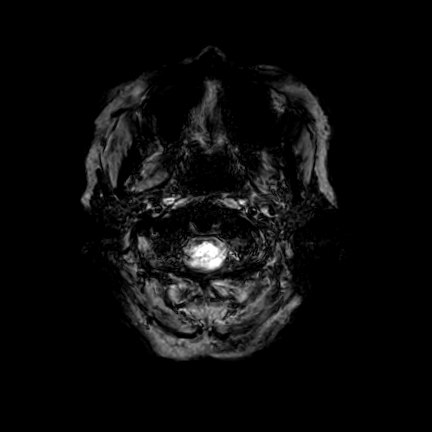
[im 26/78]
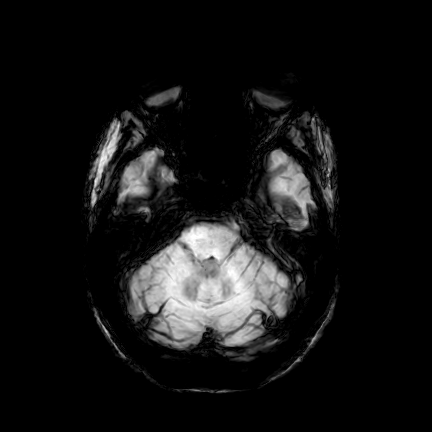
[im 52/78]
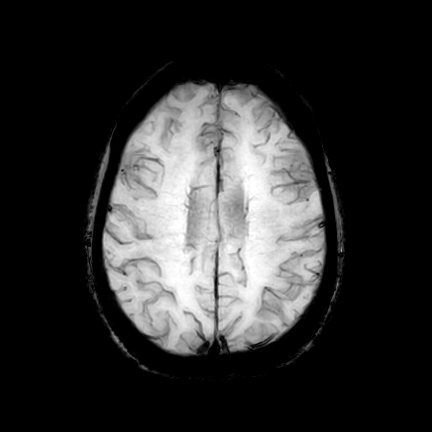
[im 78/78]
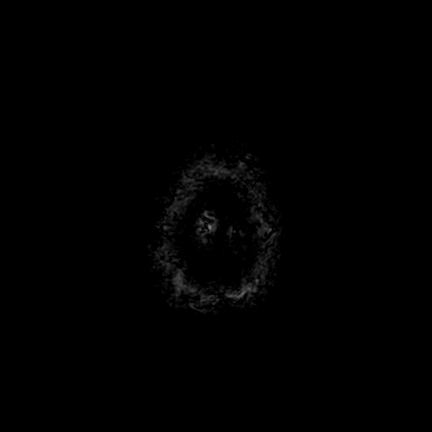

[Series 1002: T1 post-contrast · axial · 1.5mm · 0.67mm/px · z∈[-35,+124]mm · 6 of 108 slices shown (1 of 2)]
[im 1/108]
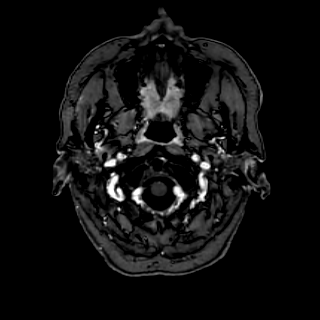
[im 22/108]
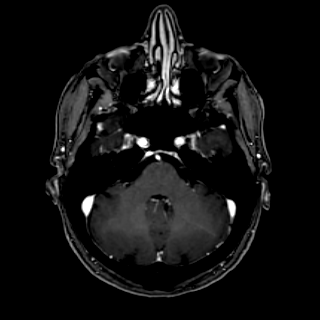
[im 43/108]
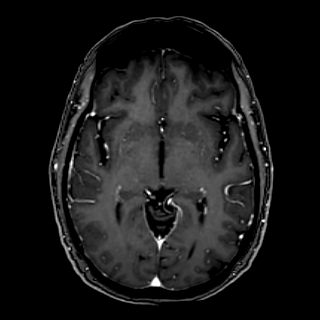
[im 65/108]
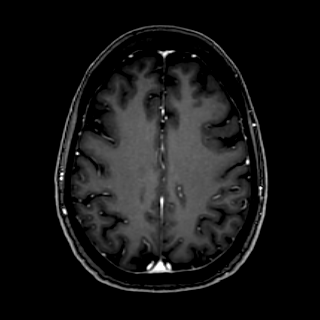
[im 86/108]
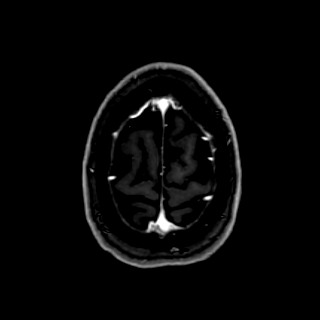
[im 108/108]
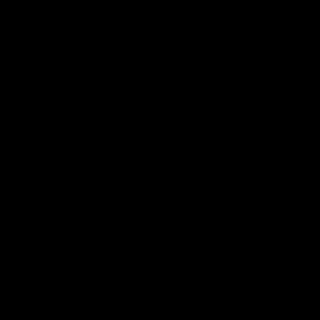

[Series 1003: T1 post-contrast · coronal · 1.5mm · 0.58mm/px · 4 of 75 slices shown (2 of 2)]
[im 1/75]
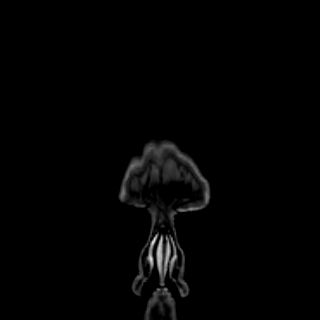
[im 25/75]
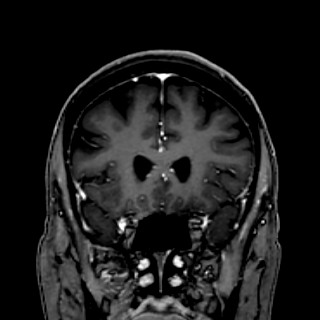
[im 50/75]
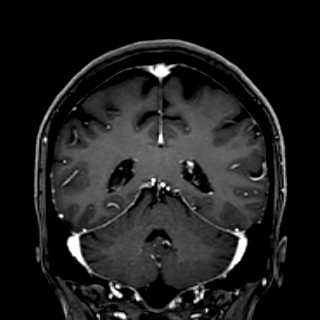
[im 75/75]
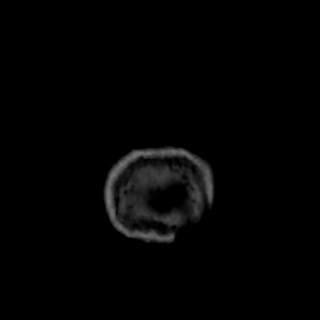

[Series 1101: T1 fat-sat post-contrast · axial · 5.0mm · 0.48mm/px · z∈[-49,+111]mm · 2 of 28 slices shown]
[im 1/28]
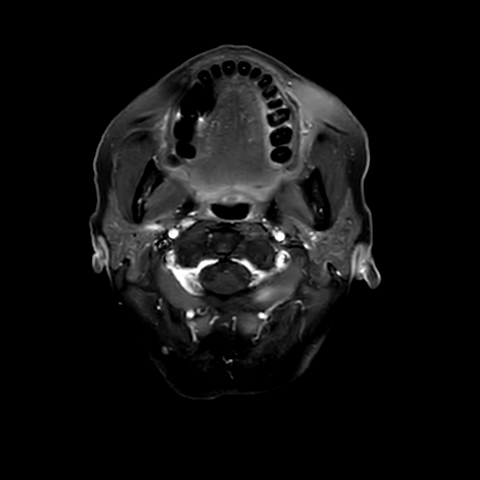
[im 28/28]
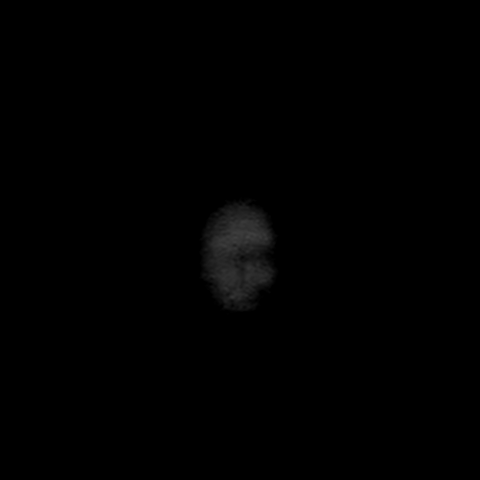

[32 of 48 positions shown; findings below may reference images not displayed]

FINDINGS: -------------------------------------------------------------------------------- 
------------------------- 
INTRACRANIAL: 
No acute ischemia. No abnormal foci of susceptibility artifact in the brain. 
Patency of intracranial vascular flow voids. No acute hemorrhage, midline shift, 
mass effect.   Periventricular and deep white matter change, probably secondary 
to microangiopathy. This is mild and stable. No pathologic enhancement in the 
brain. 
-------------------------------------------------------------------------------- 
----------------------- 
OTHER: 
ORBITS/SINUSES/T-BONES:  Visualized orbits show no acute abnormality or mass.  
Mastoid air cells and middle ear cavities are grossly clear.  Mild mucosal 
change in a right ethmoidal air cell. 
MARROW SIGNAL/SOFT TISSUES: No focal suspect signal abnormality.  
-------------------------------------------------------------------------------- 
-------------------
IMPRESSION: No acute intracranial abnormality or pathologic enhancement.  White matter 
microangiopathic changes.

## 2023-02-15 IMAGING — MR MRI ABDOMEN W/WO CONTRAST WITH MRCP
16 of 23 series · 27 of 48 positions shown · IV contrast (gadolinium)
Comparison: MR abdomen 01/18/2022.

________________________________________________________________________________________________ 
MRI ABDOMEN W/WO CONTRAST WITH MRCP, 02/15/2023 [DATE]: 
CLINICAL INDICATION: Neoplasm Of Unspecified Behavior Of Digestive System
TECHNIQUE: Multiplanar, multi acquisition MR images of the abdomen were 
performed without and with intravenous gadolinium enhancement including dynamic 
imaging.  3D MRCP sequences were performed with post processing. 7.0 mL of 
Gadavist were injected intravenously by hand. 0.5 mL of Gadavist discarded. 
Patient was scanned on a 3T magnet.

[Series 201: survey navi · axial · 15.0mm · 1.67mm/px · 1 of 11 slices shown]
[im 1/11]
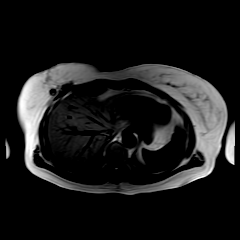

[Series 301: T2 · coronal · 5.0mm · 0.62mm/px · 1 of 30 slices shown]
[im 1/30]
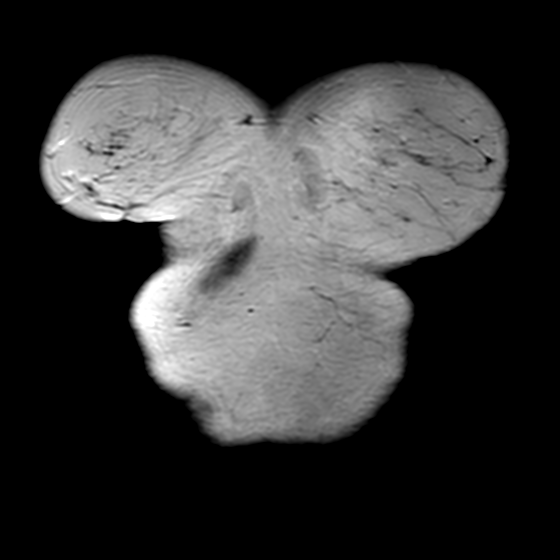

[Series 402: sout of phase · axial · 5.5mm · 1.18mm/px · 1 of 34 slices shown]
[im 1/34]
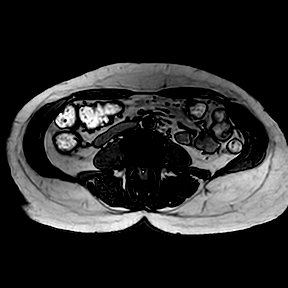

[Series 403: sin phase · axial · 5.5mm · 1.18mm/px · 1 of 34 slices shown]
[im 1/34]
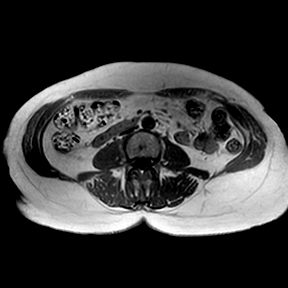

[Series 501: t2_ax_mvxd_hr_rt · axial · 5.0mm · 0.48mm/px · 1 of 36 slices shown]
[im 1/36]
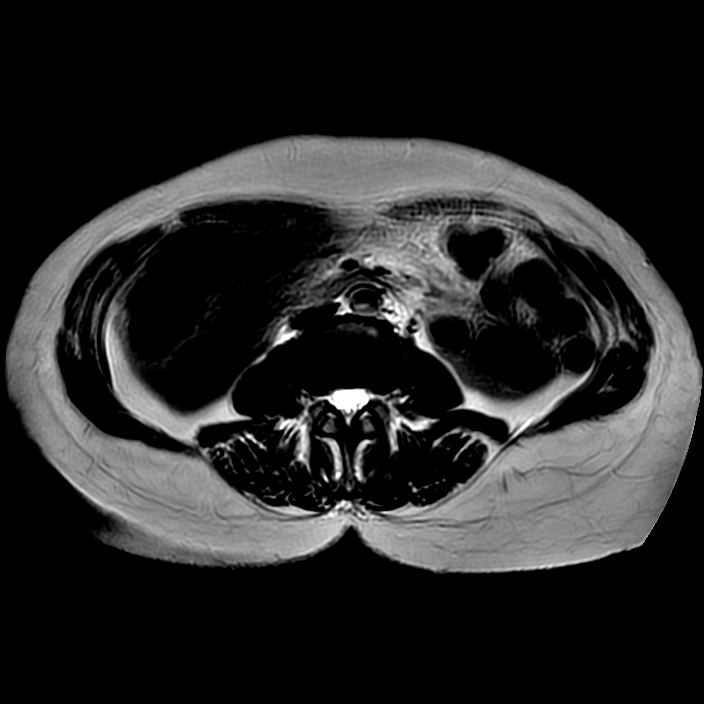

[Series 601: T2 fat-sat · axial · 3.2mm · 0.93mm/px · 1 of 38 slices shown]
[im 1/38]
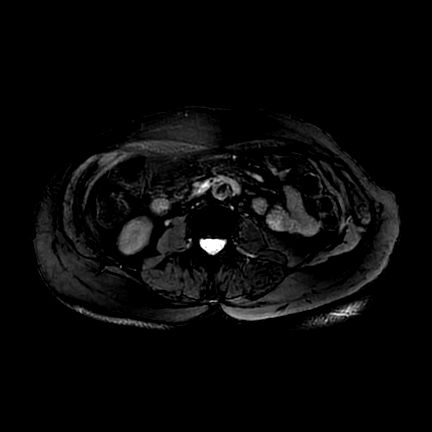

[Series 801: ssh_mrcprad · coronal · 40.0mm · 0.59mm/px · 1 of 6 slices shown]
[im 1/6]
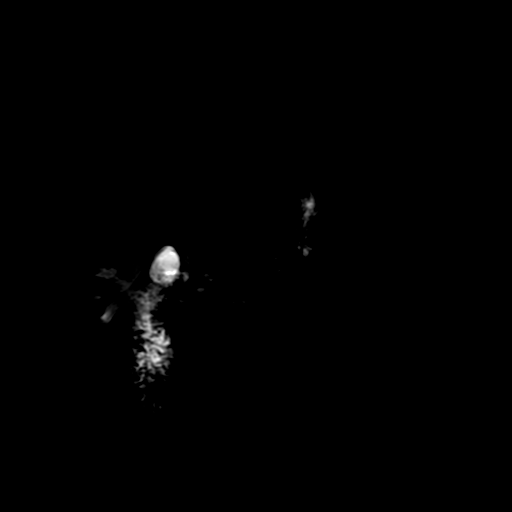

[Series 902: dadc 600 · axial · 5.0mm · 1.33mm/px · 1 of 40 slices shown]
[im 1/40]
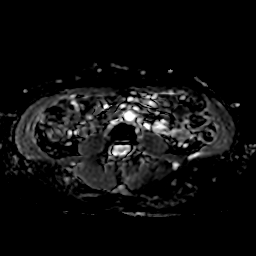

[Series 903: sb0 · axial · 5.0mm · 1.33mm/px · 1 of 40 slices shown]
[im 1/40]
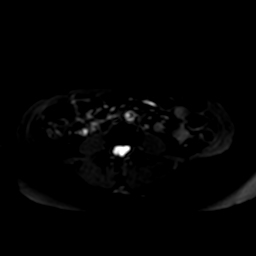

[Series 904: (id) · axial · 5.0mm · 1.33mm/px · 1 of 40 slices shown]
[im 1/40]
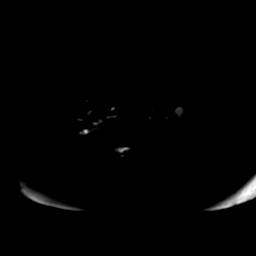

[Series 1002: DIXON · axial · 3.5mm · 0.76mm/px · z∈[-36,+183]mm · 3 of 126 slices shown (1 of 5)]
[im 1/126]
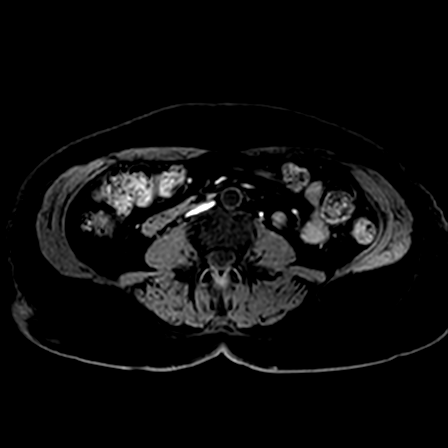
[im 63/126]
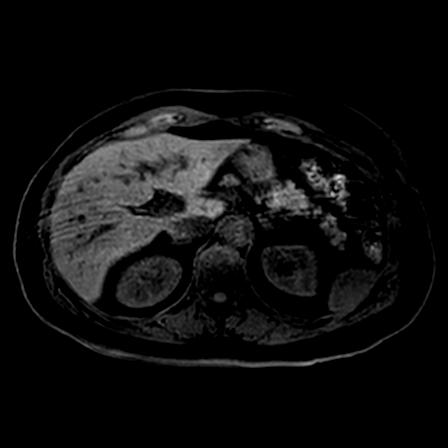
[im 126/126]
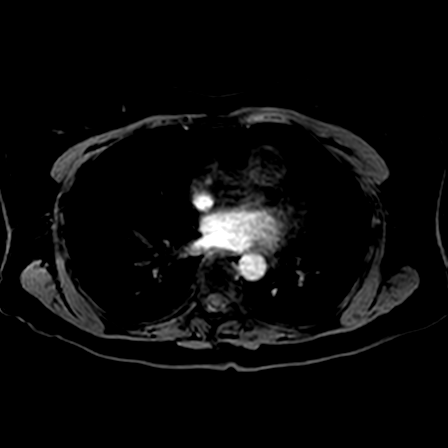

[Series 1003: DIXON · axial · 3.5mm · 0.76mm/px · z∈[-36,+183]mm · 3 of 126 slices shown (2 of 5)]
[im 1/126]
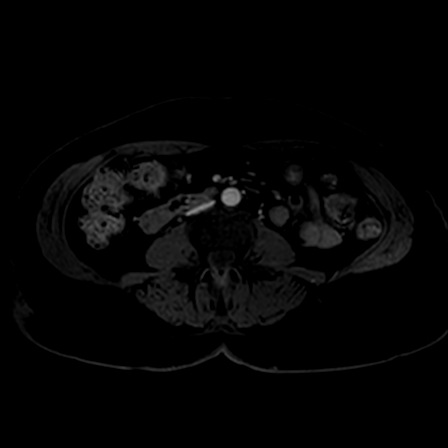
[im 63/126]
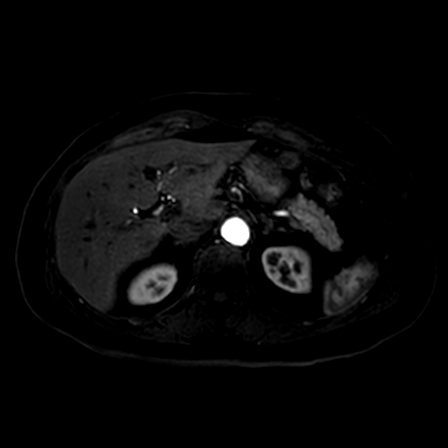
[im 126/126]
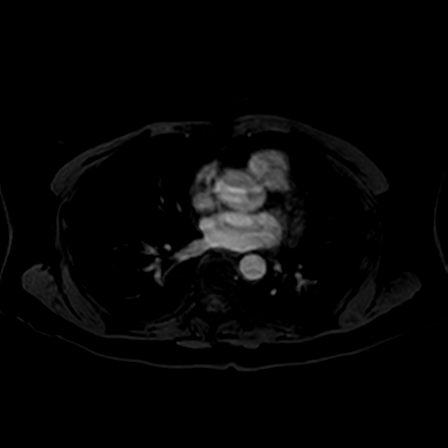

[Series 1004: DIXON · axial · 3.5mm · 0.76mm/px · z∈[-36,+183]mm · 3 of 126 slices shown (3 of 5)]
[im 1/126]
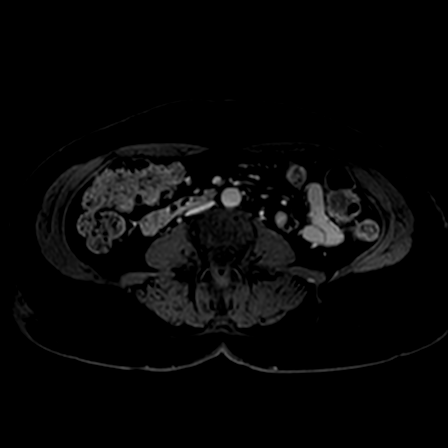
[im 63/126]
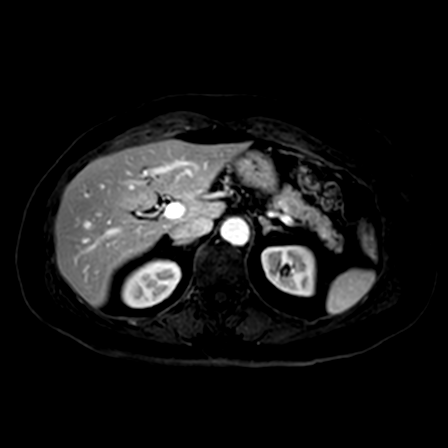
[im 126/126]
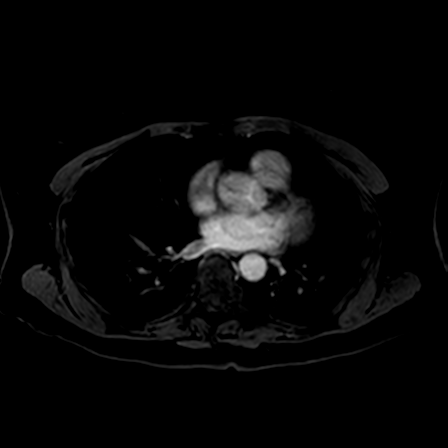

[Series 1005: DIXON · axial · 3.5mm · 0.76mm/px · z∈[-36,+183]mm · 3 of 126 slices shown (4 of 5)]
[im 1/126]
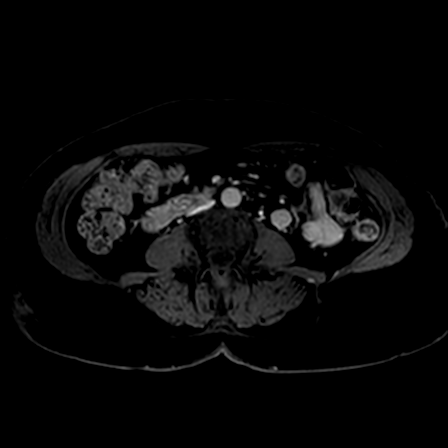
[im 63/126]
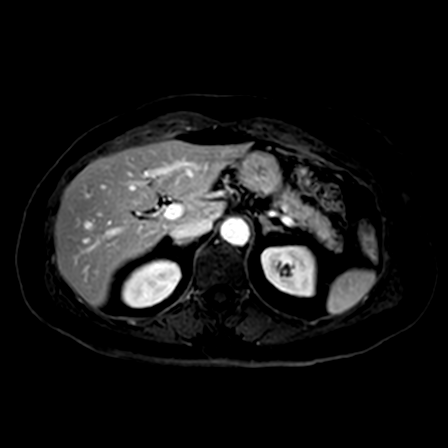
[im 126/126]
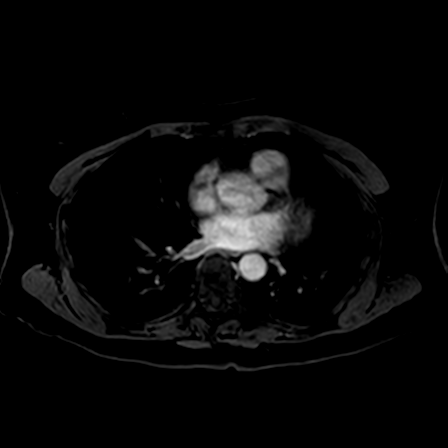

[Series 1006: DIXON · axial · 3.5mm · 0.76mm/px · z∈[-36,+183]mm · 3 of 126 slices shown (5 of 5)]
[im 1/126]
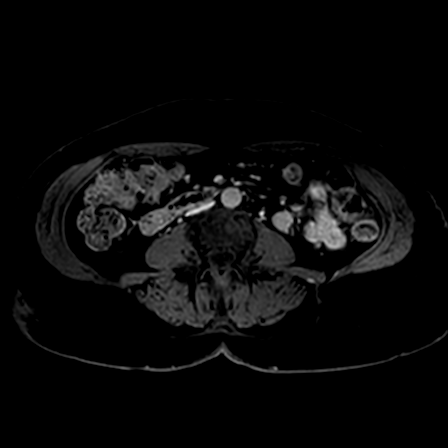
[im 63/126]
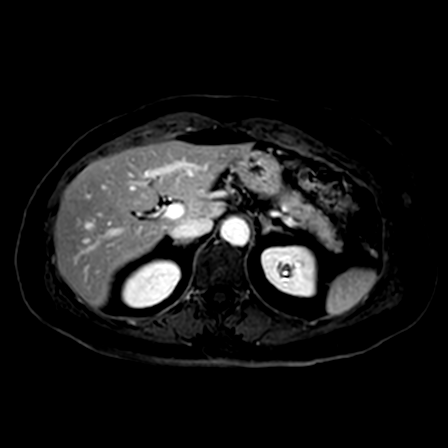
[im 126/126]
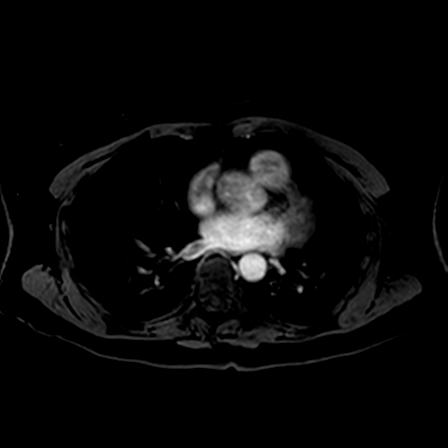

[Series 1007: DIXON post-contrast · axial · 3.5mm · 0.76mm/px · z∈[-36,+36]mm · 2 of 126 slices shown]
[im 1/126]
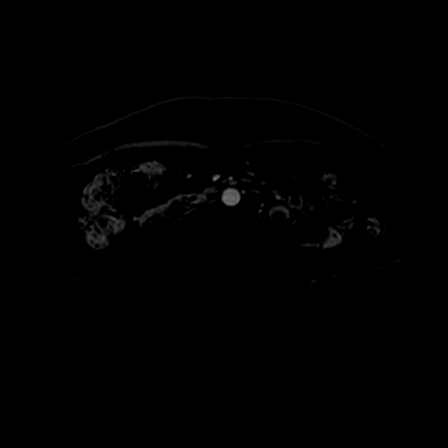
[im 42/126]
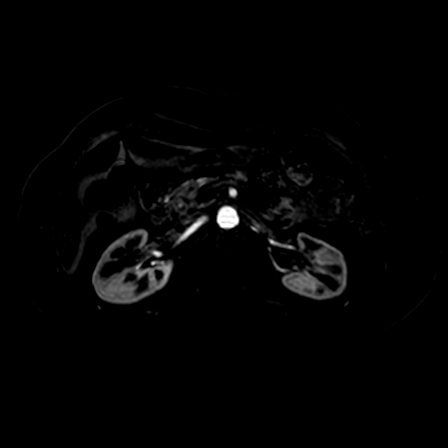

[27 of 48 positions shown; findings below may reference images not displayed]

FINDINGS: LUNG BASES: Clear. Postsurgical change right breast. 
LIVER: No mass.  No intrahepatic biliary dilatation. Portal and hepatic veins 
are normal.  
GALLBLADDER: No wall thickening.  Cholelithiasis. 
COMMON BILE DUCT: Normal caliber.  No stones. 
SPLEEN: Within normal limits. 
PANCREAS: Pancreatic cysts within the body/tail largest measuring 9 mm is stable 
compared to the previous exam. No new mass.  No pancreatic fluid collections. 
The pancreatic duct is patent.   
ADRENALS: No masses. 
KIDNEYS: No masses.  No hydronephrosis. 
LYMPH NODES: No adenopathy. 
STOMACH, SMALL BOWEL AND COLON: No bowel wall thickening or obstruction. 
PERITONEAL CAVITY: No mesenteric stranding or free fluid. 
OSSEOUS STRUCTURES: No acute fracture or destructive lesion. 
ABDOMINAL AORTA: No aneurysm.
IMPRESSION: Stable 9 mm pancreatic cyst junction of body and tail the pancreas. No new cysts 
or mass. 
Cholelithiasis but no gallbladder wall thickening.
